# Patient Record
Sex: Female | Born: 1985 | Hispanic: Yes | Marital: Single | Smoking: Never smoker
Health system: Southern US, Community
[De-identification: ages and names within clinical notes are randomized; demographics above are authoritative.]

## PROBLEM LIST (undated history)

## (undated) HISTORY — PX: APPENDECTOMY: SHX54

---

## 2014-07-28 ENCOUNTER — Emergency Department (HOSPITAL_COMMUNITY): Payer: Self-pay

## 2014-07-28 ENCOUNTER — Encounter (HOSPITAL_COMMUNITY): Payer: Self-pay | Admitting: Emergency Medicine

## 2014-07-28 ENCOUNTER — Emergency Department (HOSPITAL_COMMUNITY)
Admission: EM | Admit: 2014-07-28 | Discharge: 2014-07-28 | Disposition: A | Discharge status: Home / Self Care | Payer: Self-pay | Attending: Emergency Medicine | Admitting: Emergency Medicine

## 2014-07-28 DIAGNOSIS — Z3202 Encounter for pregnancy test, result negative: Secondary | ICD-10-CM | POA: Insufficient documentation

## 2014-07-28 DIAGNOSIS — R102 Pelvic and perineal pain: Secondary | ICD-10-CM

## 2014-07-28 DIAGNOSIS — N739 Female pelvic inflammatory disease, unspecified: Secondary | ICD-10-CM | POA: Insufficient documentation

## 2014-07-28 DIAGNOSIS — N938 Other specified abnormal uterine and vaginal bleeding: Secondary | ICD-10-CM | POA: Insufficient documentation

## 2014-07-28 LAB — BASIC METABOLIC PANEL
ANION GAP: 5 (ref 5–15)
BUN: 12 mg/dL (ref 6–23)
CHLORIDE: 103 mmol/L (ref 96–112)
CO2: 28 mmol/L (ref 19–32)
Calcium: 9.4 mg/dL (ref 8.4–10.5)
Creatinine, Ser: 0.62 mg/dL (ref 0.50–1.10)
Glucose, Bld: 93 mg/dL (ref 70–99)
POTASSIUM: 4.3 mmol/L (ref 3.5–5.1)
Sodium: 136 mmol/L (ref 135–145)

## 2014-07-28 LAB — POC URINE PREG, ED: PREG TEST UR: NEGATIVE

## 2014-07-28 LAB — URINALYSIS, ROUTINE W REFLEX MICROSCOPIC
BILIRUBIN URINE: NEGATIVE
Glucose, UA: NEGATIVE mg/dL
Hgb urine dipstick: NEGATIVE
Ketones, ur: NEGATIVE mg/dL
NITRITE: NEGATIVE
PH: 7.5 (ref 5.0–8.0)
Protein, ur: NEGATIVE mg/dL
Specific Gravity, Urine: 1.011 (ref 1.005–1.030)
Urobilinogen, UA: 0.2 mg/dL (ref 0.0–1.0)

## 2014-07-28 LAB — CBC WITH DIFFERENTIAL/PLATELET
BASOS PCT: 1 % (ref 0–1)
Basophils Absolute: 0 10*3/uL (ref 0.0–0.1)
EOS PCT: 5 % (ref 0–5)
Eosinophils Absolute: 0.4 10*3/uL (ref 0.0–0.7)
HEMATOCRIT: 43.8 % (ref 36.0–46.0)
Hemoglobin: 14.6 g/dL (ref 12.0–15.0)
Lymphocytes Relative: 32 % (ref 12–46)
Lymphs Abs: 2.3 10*3/uL (ref 0.7–4.0)
MCH: 28.5 pg (ref 26.0–34.0)
MCHC: 33.3 g/dL (ref 30.0–36.0)
MCV: 85.4 fL (ref 78.0–100.0)
MONO ABS: 0.5 10*3/uL (ref 0.1–1.0)
Monocytes Relative: 7 % (ref 3–12)
NEUTROS ABS: 3.9 10*3/uL (ref 1.7–7.7)
Neutrophils Relative %: 55 % (ref 43–77)
PLATELETS: 337 10*3/uL (ref 150–400)
RBC: 5.13 MIL/uL — ABNORMAL HIGH (ref 3.87–5.11)
RDW: 14.2 % (ref 11.5–15.5)
WBC: 7 10*3/uL (ref 4.0–10.5)

## 2014-07-28 LAB — WET PREP, GENITAL
Trich, Wet Prep: NONE SEEN
Yeast Wet Prep HPF POC: NONE SEEN

## 2014-07-28 LAB — URINE MICROSCOPIC-ADD ON

## 2014-07-28 MED ORDER — LIDOCAINE HCL (PF) 1 % IJ SOLN
INTRAMUSCULAR | Status: AC
Start: 2014-07-28 — End: 2014-07-28
  Administered 2014-07-28: 0.9 mL
  Filled 2014-07-28: qty 5

## 2014-07-28 MED ORDER — DOXYCYCLINE HYCLATE 100 MG PO CAPS: 100.0000 mg | ORAL_CAPSULE | Freq: Two times a day (BID) | ORAL | Status: AC

## 2014-07-28 MED ORDER — METRONIDAZOLE 500 MG PO TABS: 500.0000 mg | ORAL_TABLET | Freq: Two times a day (BID) | ORAL | Status: DC

## 2014-07-28 MED ORDER — DEXTROSE 5 % IV SOLN
1.0000 g | Freq: Once | INTRAVENOUS | Status: DC
  Filled 2014-07-28: qty 10

## 2014-07-28 MED ORDER — CEFTRIAXONE SODIUM 250 MG IJ SOLR
250.0000 mg | Freq: Once | INTRAMUSCULAR | Status: AC
  Administered 2014-07-28: 250 mg via INTRAMUSCULAR
  Filled 2014-07-28: qty 250

## 2014-07-28 MED ORDER — CEFTRIAXONE SODIUM 250 MG IJ SOLR: 250.0000 mg | Freq: Once | INTRAMUSCULAR | Status: DC

## 2014-07-28 NOTE — Discharge Instructions (Signed)
Pelvic Inflammatory Disease °Pelvic inflammatory disease (PID) refers to an infection in some or all of the female organs. The infection can be in the uterus, ovaries, fallopian tubes, or the surrounding tissues in the pelvis. PID can cause abdominal or pelvic pain that comes on suddenly (acute pelvic pain). PID is a serious infection because it can lead to lasting (chronic) pelvic pain or the inability to have children (infertile).  °CAUSES  °The infection is often caused by the normal bacteria found in the vaginal tissues. PID may also be caused by an infection that is spread during sexual contact. PID can also occur following:  °· The birth of a baby.   °· A miscarriage.   °· An abortion.   °· Major pelvic surgery.   °· The use of an intrauterine device (IUD).   °· A sexual assault.   °RISK FACTORS °Certain factors can put a person at higher risk for PID, such as: °· Being younger than 25 years. °· Being sexually active at a young age. °· Using nonbarrier contraception. °· Having multiple sexual partners. °· Having sex with someone who has symptoms of a genital infection. °· Using oral contraception. °Other times, certain behaviors can increase the possibility of getting PID, such as: °· Having sex during your period. °· Using a vaginal douche. °· Having an intrauterine device (IUD) in place. °SYMPTOMS  °· Abdominal or pelvic pain.   °· Fever.   °· Chills.   °· Abnormal vaginal discharge. °· Abnormal uterine bleeding.   °· Unusual pain shortly after finishing your period. °DIAGNOSIS  °Your caregiver will choose some of the following methods to make a diagnosis, such as:  °· Performing a physical exam and history. A pelvic exam typically reveals a very tender uterus and surrounding pelvis.   °· Ordering laboratory tests including a pregnancy test, blood tests, and urine test.  °· Ordering cultures of the vagina and cervix to check for a sexually transmitted infection (STI). °· Performing an ultrasound.    °· Performing a laparoscopic procedure to look inside the pelvis.   °TREATMENT  °· Antibiotic medicines may be prescribed and taken by mouth.   °· Sexual partners may be treated when the infection is caused by a sexually transmitted disease (STD).   °· Hospitalization may be needed to give antibiotics intravenously. °· Surgery may be needed, but this is rare. °It may take weeks until you are completely well. If you are diagnosed with PID, you should also be checked for human immunodeficiency virus (HIV).   °HOME CARE INSTRUCTIONS  °· If given, take your antibiotics as directed. Finish the medicine even if you start to feel better.   °· Only take over-the-counter or prescription medicines for pain, discomfort, or fever as directed by your caregiver.   °· Do not have sexual intercourse until treatment is completed or as directed by your caregiver. If PID is confirmed, your recent sexual partner(s) will need treatment.   °· Keep your follow-up appointments. °SEEK MEDICAL CARE IF:  °· You have increased or abnormal vaginal discharge.   °· You need prescription medicine for your pain.   °· You vomit.   °· You cannot take your medicines.   °· Your partner has an STD.   °SEEK IMMEDIATE MEDICAL CARE IF:  °· You have a fever.   °· You have increased abdominal or pelvic pain.   °· You have chills.   °· You have pain when you urinate.   °· You are not better after 72 hours following treatment.   °MAKE SURE YOU:  °· Understand these instructions. °· Will watch your condition. °· Will get help right away if you are not doing well or get worse. °  Document Released: 03/25/2005 Document Revised: 07/20/2012 Document Reviewed: 03/21/2011 °[employer]® Patient Information ©2015 [employer]. This information is not intended to replace advice given to you by your health care provider. Make sure you discuss any questions you have with your health care provider. ° °

## 2014-07-28 NOTE — ED Notes (Signed)
Pt directed to change into a gown.

## 2014-07-28 NOTE — ED Notes (Signed)
Patient transported to Ultrasound 

## 2014-07-28 NOTE — ED Notes (Signed)
Pt through husband who is interpreting reports abdominal pain x 2 weeks that is worse x 2 days with nausea. Denies vomiting or diarrhea. Denies urinary symptoms. Reports light vaginal bleeding x2 days.

## 2014-07-28 NOTE — ED Notes (Signed)
Pt educated to take all medication to completion.  Verbalized understanding.

## 2014-07-28 NOTE — ED Provider Notes (Signed)
CSN: [id number]     Arrival date & time 07/28/14  4098 History   First MD Initiated Contact with Patient 07/28/14 [phone]     Chief Complaint  Patient presents with  . Abdominal Pain     (Consider location/radiation/quality/duration/timing/severity/associated sxs/prior Treatment) Patient is a 29 y.o. female presenting with abdominal pain. The history is provided by the patient.  Abdominal Pain Associated symptoms: nausea, vaginal bleeding and vomiting   Associated symptoms: no fever, no shortness of breath and no vaginal discharge    patient presents with lower abdominal pain for last 2 weeks. It is dull. States it does radiate up to the epigastric area. No diarrhea or constipation. She has had some nausea and vomiting. States she has had 2 days of slight vaginal bleeding. Denies vaginal discharge. Has had a somewhat decreased appetite. She is 8 months postpartum. States she was found in [place] to have some abnormality in her uterus that could be associated with cancer but states that it was found not to be cancer. The pain is dull and constant. It is worse with certain movements.  History reviewed. No pertinent past medical history. History reviewed. No pertinent past surgical history. No family history on file. History  Substance Use Topics  . Smoking status: Never Smoker   . Smokeless tobacco: Not on file  . Alcohol Use: No   OB History    No data available     Review of Systems  Constitutional: Positive for appetite change. Negative for fever.  Respiratory: Negative for shortness of breath.   Gastrointestinal: Positive for nausea, vomiting and abdominal pain.  Genitourinary: Positive for vaginal bleeding. Negative for vaginal discharge and vaginal pain.      Allergies  Review of patient's allergies indicates no known allergies.  Home Medications   Prior to Admission medications   Medication Sig Start Date End Date Taking? Authorizing Provider  doxycycline (VIBRAMYCIN) 100  MG capsule Take 1 capsule (100 mg total) by mouth 2 (two) times daily. 07/28/14   Benjiman Core, MD  metroNIDAZOLE (FLAGYL) 500 MG tablet Take 1 tablet (500 mg total) by mouth 2 (two) times daily. 07/28/14   Benjiman Core, MD   BP 102/58 mmHg  Pulse 83  Temp(Src) 97.6 F (36.4 C) (Oral)  Resp 16  SpO2 99%  LMP 06/30/2014 Physical Exam  Constitutional: She appears well-developed and well-nourished.  Eyes: Pupils are equal, round, and reactive to light.  Cardiovascular: Normal rate.   Pulmonary/Chest: Effort normal.  Abdominal: There is tenderness.  Lower abdominal tenderness without rebound or guarding. No masses. Mild epigastric tenderness also. Not severe as lower abdomen tenderness.  Musculoskeletal: Normal range of motion.  Neurological: She is alert.  Skin: Skin is warm.  thick white vaginal D/c with some CMT.  ED Course  Procedures (including critical care time) Labs Review Labs Reviewed  WET PREP, GENITAL - Abnormal; Notable for the following:    Clue Cells Wet Prep HPF POC MODERATE (*)    WBC, Wet Prep HPF POC MANY (*)    All other components within normal limits  URINALYSIS, ROUTINE W REFLEX MICROSCOPIC - Abnormal; Notable for the following:    APPearance CLOUDY (*)    Leukocytes, UA SMALL (*)    All other components within normal limits  CBC WITH DIFFERENTIAL/PLATELET - Abnormal; Notable for the following:    RBC 5.13 (*)    All other components within normal limits  URINE MICROSCOPIC-ADD ON - Abnormal; Notable for the following:    Squamous Epithelial /  LPF FEW (*)    Bacteria, UA FEW (*)    All other components within normal limits  BASIC METABOLIC PANEL  RPR  HIV ANTIBODY (ROUTINE TESTING)  POC URINE PREG, ED  GC/CHLAMYDIA PROBE AMP ([place])    Imaging Review No results found.   EKG Interpretation None      MDM   Final diagnoses:  PID (pelvic inflammatory disease)    Patient with abdominal pain. Will treat as PID> will follow with  Gyn.    Benjiman CoreNathan Lucky Alverson, MD 08/01/14 [phone]

## 2014-07-29 LAB — HIV ANTIBODY (ROUTINE TESTING W REFLEX): HIV SCREEN 4TH GENERATION: NONREACTIVE

## 2014-07-29 LAB — RPR: RPR: NONREACTIVE

## 2014-07-29 LAB — GC/CHLAMYDIA PROBE AMP (~~LOC~~) NOT AT ARMC
CHLAMYDIA, DNA PROBE: NEGATIVE
Neisseria Gonorrhea: NEGATIVE

## 2014-12-12 ENCOUNTER — Encounter (HOSPITAL_COMMUNITY): Payer: Self-pay | Admitting: Emergency Medicine

## 2014-12-12 ENCOUNTER — Emergency Department (HOSPITAL_COMMUNITY)
Admission: EM | Admit: 2014-12-12 | Discharge: 2014-12-12 | Disposition: A | Discharge status: Home / Self Care | Payer: Self-pay | Attending: Emergency Medicine | Admitting: Emergency Medicine

## 2014-12-12 DIAGNOSIS — N760 Acute vaginitis: Secondary | ICD-10-CM

## 2014-12-12 DIAGNOSIS — B9689 Other specified bacterial agents as the cause of diseases classified elsewhere: Secondary | ICD-10-CM

## 2014-12-12 DIAGNOSIS — Z3491 Encounter for supervision of normal pregnancy, unspecified, first trimester: Secondary | ICD-10-CM

## 2014-12-12 DIAGNOSIS — N390 Urinary tract infection, site not specified: Secondary | ICD-10-CM

## 2014-12-12 DIAGNOSIS — O23591 Infection of other part of genital tract in pregnancy, first trimester: Secondary | ICD-10-CM | POA: Insufficient documentation

## 2014-12-12 DIAGNOSIS — Z3A00 Weeks of gestation of pregnancy not specified: Secondary | ICD-10-CM | POA: Insufficient documentation

## 2014-12-12 DIAGNOSIS — O2341 Unspecified infection of urinary tract in pregnancy, first trimester: Secondary | ICD-10-CM | POA: Insufficient documentation

## 2014-12-12 LAB — COMPREHENSIVE METABOLIC PANEL
ALK PHOS: 53 U/L (ref 38–126)
ALT: 14 U/L (ref 14–54)
AST: 24 U/L (ref 15–41)
Albumin: 4.1 g/dL (ref 3.5–5.0)
Anion gap: 7 (ref 5–15)
BUN: 8 mg/dL (ref 6–20)
CO2: 25 mmol/L (ref 22–32)
CREATININE: 0.61 mg/dL (ref 0.44–1.00)
Calcium: 9.4 mg/dL (ref 8.9–10.3)
Chloride: 102 mmol/L (ref 101–111)
GFR calc non Af Amer: >60 mL/min (ref >60)
GLUCOSE: 95 mg/dL (ref 65–99)
Potassium: 3.9 mmol/L (ref 3.5–5.1)
SODIUM: 134 mmol/L — AB (ref 135–145)
Total Bilirubin: 0.6 mg/dL (ref 0.3–1.2)
Total Protein: 7.7 g/dL (ref 6.5–8.1)

## 2014-12-12 LAB — CBC
HCT: 39.3 % (ref 36.0–46.0)
Hemoglobin: 13 g/dL (ref 12.0–15.0)
MCH: 28.1 pg (ref 26.0–34.0)
MCHC: 33.1 g/dL (ref 30.0–36.0)
MCV: 85.1 fL (ref 78.0–100.0)
PLATELETS: 322 10*3/uL (ref 150–400)
RBC: 4.62 MIL/uL (ref 3.87–5.11)
RDW: 13.4 % (ref 11.5–15.5)
WBC: 11.2 10*3/uL — ABNORMAL HIGH (ref 4.0–10.5)

## 2014-12-12 LAB — URINALYSIS, ROUTINE W REFLEX MICROSCOPIC
Bilirubin Urine: NEGATIVE
GLUCOSE, UA: NEGATIVE mg/dL
HGB URINE DIPSTICK: NEGATIVE
KETONES UR: NEGATIVE mg/dL
Nitrite: NEGATIVE
PH: 6 (ref 5.0–8.0)
PROTEIN: NEGATIVE mg/dL
Specific Gravity, Urine: 1.031 — ABNORMAL HIGH (ref 1.005–1.030)
Urobilinogen, UA: 1 mg/dL (ref 0.0–1.0)

## 2014-12-12 LAB — URINE MICROSCOPIC-ADD ON

## 2014-12-12 LAB — WET PREP, GENITAL
Trich, Wet Prep: NONE SEEN
YEAST WET PREP: NONE SEEN

## 2014-12-12 LAB — I-STAT BETA HCG BLOOD, ED (MC, WL, AP ONLY): I-stat hCG, quantitative: >2000 m[IU]/mL — ABNORMAL HIGH (ref <5)

## 2014-12-12 LAB — LIPASE, BLOOD: Lipase: 25 U/L (ref 22–51)

## 2014-12-12 MED ORDER — CEPHALEXIN 500 MG PO CAPS: 500.0000 mg | ORAL_CAPSULE | Freq: Three times a day (TID) | ORAL | Status: AC

## 2014-12-12 MED ORDER — METRONIDAZOLE 500 MG PO TABS: 500.0000 mg | ORAL_TABLET | Freq: Two times a day (BID) | ORAL | Status: AC

## 2014-12-12 NOTE — ED Provider Notes (Signed)
CSN: [id number]     Arrival date & time 12/12/14  1620 History   First MD Initiated Contact with Patient 12/12/14 1941     Chief Complaint  Patient presents with  . Abdominal Pain     (Consider location/radiation/quality/duration/timing/severity/associated sxs/prior Treatment) Patient is a 29 y.o. female presenting with abdominal pain. The history is provided by the patient and the spouse.  Abdominal Pain Pain location:  Suprapubic Pain quality: aching   Pain radiates to:  Does not radiate Pain severity:  Mild Onset quality:  Gradual Duration:  2 days Timing:  Constant Progression:  Worsening Chronicity:  New Associated symptoms: no constipation, no cough, no diarrhea, no fever, no shortness of breath, no vaginal bleeding and no vaginal discharge   Associated symptoms comment:  Urinary frequency Risk factors: pregnancy     History reviewed. No pertinent past medical history. No past surgical history on file. No family history on file. Social History  Substance Use Topics  . Smoking status: Never Smoker   . Smokeless tobacco: None  . Alcohol Use: No   OB History    No data available     Review of Systems  Constitutional: Negative for fever.  Respiratory: Negative for cough and shortness of breath.   Gastrointestinal: Positive for abdominal pain. Negative for diarrhea and constipation.  Genitourinary: Negative for vaginal bleeding and vaginal discharge.  All other systems reviewed and are negative.     Allergies  Review of patient's allergies indicates no known allergies.  Home Medications   Prior to Admission medications   Medication Sig Start Date End Date Taking? Authorizing Provider  doxycycline (VIBRAMYCIN) 100 MG capsule Take 1 capsule (100 mg total) by mouth 2 (two) times daily. Patient not taking: Reported on 12/12/2014 07/28/14   Benjiman Core, MD  metroNIDAZOLE (FLAGYL) 500 MG tablet Take 1 tablet (500 mg total) by mouth 2 (two) times daily. Patient  not taking: Reported on 12/12/2014 07/28/14   Benjiman Core, MD   BP 102/64 mmHg  Pulse 92  Temp(Src) 97.4 F (36.3 C) (Oral)  Resp 18  SpO2 100% Physical Exam  Constitutional: She is oriented to person, place, and time. She appears well-developed and well-nourished. No distress.  HENT:  Head: Normocephalic.  Eyes: Conjunctivae are normal.  Neck: Neck supple. No tracheal deviation present.  Cardiovascular: Normal rate and regular rhythm.   Pulmonary/Chest: Effort normal. No respiratory distress.  Abdominal: Soft. She exhibits no distension.  Genitourinary: Uterus normal. Cervix exhibits no motion tenderness. Right adnexum displays no tenderness. Left adnexum displays no tenderness. Vaginal discharge (physiologic) found.  Neurological: She is alert and oriented to person, place, and time.  Skin: Skin is warm and dry.  Psychiatric: She has a normal mood and affect.    ED Course  Procedures (including critical care time)  EMERGENCY DEPARTMENT [place] PREGNANCY "Study: Limited Ultrasound of the Pelvis"  INDICATIONS:Pregnancy(required) and Pelvic pain Multiple views of the uterus and pelvic cavity are obtained with a multi-frequency probe.  APPROACH:Transabdominal   PERFORMED BY: Myself  IMAGES ARCHIVED?: Yes  LIMITATIONS: Body habitus  PREGNANCY FREE FLUID: None  PREGNANCY UTERUS FINDINGS:Uterus normal size and Gestational sac noted ADNEXAL FINDINGS:none  PREGNANCY FINDINGS: Intrauterine gestational sac noted, Fetal pole present and Fetal heart activity seen  INTERPRETATION: Viable intrauterine pregnancy and Pelvic free fluid absent  Labs Review Labs Reviewed  WET PREP, GENITAL - Abnormal; Notable for the following:    Clue Cells Wet Prep HPF POC FEW (*)    WBC, Wet Prep HPF  POC TOO NUMEROUS TO COUNT (*)    All other components within normal limits  COMPREHENSIVE METABOLIC PANEL - Abnormal; Notable for the following:    Sodium 134 (*)    All other components within  normal limits  CBC - Abnormal; Notable for the following:    WBC 11.2 (*)    All other components within normal limits  URINALYSIS, ROUTINE W REFLEX MICROSCOPIC (NOT AT [facility]) - Abnormal; [facility]    APPearance CLOUDY (*)    Specific Gravity, Urine 1.031 (*)    Leukocytes, UA MODERATE (*)    All other components within normal limits  URINE MICROSCOPIC-ADD ON - Abnormal; Notable for the following:    Squamous Epithelial / LPF FEW (*)    Crystals CA OXALATE CRYSTALS (*)    All other components within normal limits  I-STAT BETA HCG BLOOD, ED ([facility], [facility], [facility] ONLY) - Abnormal; Notable for the following:    I-stat hCG, quantitative >2000.0 (*)    All other components within normal limits  LIPASE, BLOOD  RPR  GC/CHLAMYDIA PROBE AMP ([place]) NOT AT [facility]    Imaging Review No results found. I have personally reviewed and evaluated these images and lab results as part of my medical decision-making.   EKG Interpretation None      MDM   Final diagnoses:  UTI (lower urinary tract infection)  BV (bacterial vaginosis)  Normal IUP (intrauterine pregnancy) on prenatal ultrasound, first trimester    29 year old female presents with lower abdominal pain and increased urinary frequency starting yesterday. She is complaining of nausea and headache as well. She is newly pregnant and took a pregnancy test for the first time yesterday. She is nontoxic appearing, afebrile, has stable vital signs during her emergency department course. IUP was identified as above, labs notable for mild leukocytosis and pyuria suggestive of urinary tract infection. Will cover for BV with some discharge, clue cells and symptoms. Recommended prenatal care, vitamins, antibiotics for UTI and follow-up. Plan to follow up with PCP as needed and return precautions discussed for worsening or new concerning symptoms.   Lyndal Pulley, MD 12/13/14 [phone]

## 2014-12-12 NOTE — Discharge Instructions (Signed)
Bacterial Vaginosis Bacterial vaginosis is a vaginal infection that occurs when the normal balance of bacteria in the vagina is disrupted. It results from an overgrowth of certain bacteria. This is the most common vaginal infection in women of childbearing age. Treatment is important to prevent complications, especially in pregnant women, as it can cause a premature delivery. CAUSES  Bacterial vaginosis is caused by an increase in harmful bacteria that are normally present in smaller amounts in the vagina. Several different kinds of bacteria can cause bacterial vaginosis. However, the reason that the condition develops is not fully understood. RISK FACTORS Certain activities or behaviors can put you at an increased risk of developing bacterial vaginosis, including:  Having a new sex partner or multiple sex partners.  Douching.  Using an intrauterine device (IUD) for contraception. Women do not get bacterial vaginosis from toilet seats, bedding, swimming pools, or contact with objects around them. SIGNS AND SYMPTOMS  Some women with bacterial vaginosis have no signs or symptoms. Common symptoms include:  Grey vaginal discharge.  A fishlike odor with discharge, especially after sexual intercourse.  Itching or burning of the vagina and vulva.  Burning or pain with urination. DIAGNOSIS  Your health care provider will take a medical history and examine the vagina for signs of bacterial vaginosis. A sample of vaginal fluid may be taken. Your health care provider will look at this sample under a microscope to check for bacteria and abnormal cells. A vaginal pH test may also be done.  TREATMENT  Bacterial vaginosis may be treated with antibiotic medicines. These may be given in the form of a pill or a vaginal cream. A second round of antibiotics may be prescribed if the condition comes back after treatment.  HOME CARE INSTRUCTIONS   Only take over-the-counter or prescription medicines as  directed by your health care provider.  If antibiotic medicine was prescribed, take it as directed. Make sure you finish it even if you start to feel better.  Do not have sex until treatment is completed.  Tell all sexual partners that you have a vaginal infection. They should see their health care provider and be treated if they have problems, such as a mild rash or itching.  Practice safe sex by using condoms and only having one sex partner. SEEK MEDICAL CARE IF:   Your symptoms are not improving after 3 days of treatment.  You have increased discharge or pain.  You have a fever. MAKE SURE YOU:   Understand these instructions.  Will watch your condition.  Will get help right away if you are not doing well or get worse. FOR MORE INFORMATION  Centers for Disease Control and Prevention, Division of STD Prevention: SolutionApps.co.za American Sexual Health Association (ASHA): www.ashastd.org  Document Released: 03/25/2005 Document Revised: 01/13/2013 Document Reviewed: 11/04/2012 [facility] Patient Information 2015 [place], [place]. This information is not intended to replace advice given to you by your health care provider. Make sure you discuss any questions you have with your health care provider. Primer trimestre de Psychiatrist (First Trimester of Pregnancy) El primer trimestre de Psychiatrist se extiende desde la semana1 hasta el final de la semana12 (mes1 al mes3). Una semana despus de que un espermatozoide fecunda un vulo, este se implantar en la pared uterina. Este embrin comenzar a Camera operator convertirse en un beb. Sus genes y los de su pareja forman el beb. Los genes del varn determinan si ser un nio o una nia. Entre la semana6 y [place], se forman los ojos  y Haynes Hoehn, y los latidos del corazn pueden verse en la ecografa. Al final de las 12semanas, todos los rganos del beb estn formados.  Ahora que est embarazada, querr hacer todo lo que est a su alcance para  tener un beb sano. Dos de las cosas ms importantes son Winferd Humphrey buena atencin prenatal y seguir las indicaciones del mdico. La atencin prenatal incluye toda la asistencia mdica que usted recibe antes del nacimiento del beb. Esta ayudar a prevenir, Engineer, manufacturing y tratar cualquier problema durante el embarazo y [place]. CAMBIOS EN EL ORGANISMO Su organismo atraviesa por muchos cambios durante el [place], y estos varan de Neomia Dear mujer a Educational psychologist.   Al principio, puede aumentar o bajar algunos kilos.  Puede tener Programme researcher, broadcasting/film/video (nuseas) y vomitar. Si no puede controlar los vmitos, llame al mdico.  Puede cansarse con facilidad.  Es posible que tenga dolores de cabeza que pueden aliviarse con los medicamentos que el mdico le permita tomar.  Puede orinar con mayor frecuencia. El dolor al orinar puede significar que usted tiene una infeccin de la vejiga.  Debido al Vanetta Mulders, puede tener acidez estomacal.  Puede estar estreida, ya que ciertas hormonas enlentecen los movimientos de los msculos que [employer] desechos a travs de los intestinos.  Pueden aparecer hemorroides o abultarse e hincharse las venas (venas varicosas).  Las [employer] pueden empezar a Government social research officer y Emergency planning/management officer. Los pezones pueden sobresalir ms, y el tejido que los rodea (areola) tornarse ms oscuro.  Las Veterinary surgeon y estar sensibles al cepillado y al hilo dental.  Pueden aparecer zonas oscuras o manchas (cloasma, mscara del Psychiatrist) en el rostro que probablemente se atenuarn despus del nacimiento del beb.  Los perodos menstruales se interrumpirn.  Tal vez no tenga apetito.  Puede sentir un fuerte deseo de consumir ciertos alimentos.  Puede tener cambios a Theatre manager a da, por ejemplo, por momentos puede estar emocionada por el Psychiatrist y por otros preocuparse porque algo pueda salir mal con el embarazo o el beb.  Tendr sueos ms vvidos y extraos.  Tal vez haya cambios en el  cabello que pueden incluir su engrosamiento, crecimiento rpido y cambios en la textura. A algunas mujeres tambin se les cae el cabello durante o despus del [place], o tienen el cabello seco o fino. Lo ms probable es que el cabello se le normalice despus del nacimiento del beb. QU DEBE ESPERAR EN LAS CONSULTAS PRENATALES Durante una visita prenatal de rutina:  La pesarn para asegurarse de que usted y el beb estn creciendo normalmente.  Le controlarn la presin arterial.  Le medirn el abdomen para controlar el desarrollo del beb.  Se escucharn los latidos cardacos a partir de la semana10 o la12 de embarazo, aproximadamente.  Se analizarn los resultados de los estudios solicitados en visitas anteriores. El mdico puede preguntarle:  Cmo se siente.  Si siente los movimientos del beb.  Si ha tenido sntomas anormales, como prdida de lquido, [place], dolores de cabeza intensos o clicos abdominales.  Si tiene [employer]. Otros estudios que pueden realizarse durante el primer trimestre incluyen lo siguiente:  Anlisis de sangre para determinar el tipo de sangre y Engineer, manufacturing la presencia de infecciones previas. Adems, se los usar para controlar si los niveles de hierro son bajos (anemia) y Chief Strategy Officer los anticuerpos Rh. En una etapa ms avanzada del [place], se harn anlisis de sangre para saber si tiene diabetes, junto con otros estudios si surgen problemas.  Anlisis de [place] para  detectar infecciones, diabetes o protenas en la orina.  Una ecografa para confirmar que el beb crece y se desarrolla correctamente.  Una amniocentesis para diagnosticar posibles problemas genticos.  Estudios del feto para descartar espina bfida y sndrome de [place].  Es posible que necesite otras pruebas adicionales. INSTRUCCIONES PARA EL CUIDADO EN EL HOGAR  Medicamentos:  Siga las indicaciones del mdico en relacin con el uso de medicamentos. Durante el embarazo, hay  medicamentos que pueden tomarse y otros que no.  Tome las vitaminas prenatales como se le indic.  Si est estreida, tome un laxante suave, si el mdico lo [place]. Dieta  Consuma alimentos balanceados. Elija alimentos variados, como carne o protenas de origen vegetal, pescado, leche y productos lcteos descremados, verduras, frutas y panes y Radiation protection practitioner. El mdico la ayudar a Production assistant, radio cantidad de peso que puede [place].  No coma carne cruda ni quesos sin cocinar. Estos elementos contienen bacterias que pueden causar defectos congnitos en el beb.  La ingesta diaria de cuatro o cinco comidas pequeas en lugar de tres comidas abundantes puede ayudar a [employer] nuseas y los vmitos. Si empieza a tener nuseas, comer algunas [address] puede ser de [place]. Beber lquidos [place] comidas en lugar de tomarlos durante las comidas tambin puede ayudar a Optician, dispensing las nuseas y los vmitos.  Si est estreida, consuma alimentos con alto contenido de [place], como verduras y frutas frescas, y Radiation protection practitioner. Beba suficiente lquido para Photographer orina clara o de color amarillo plido. Actividad y Landscape architect ejercicio solamente como se lo haya indicado el mdico. El ejercicio la ayudar a:  Art gallery manager.  Mantenerse en forma.  Estar preparada para el trabajo de parto y [place].  Los dolores, los clicos en la parte baja del abdomen o los calambres en la cintura son un buen indicio de que debe dejar de Corporate treasurer. Consulte al mdico antes de seguir haciendo ejercicios normales.  Intente no estar de pie [employer]Ex. Mueva las piernas con frecuencia si debe estar de pie en un lugar durante mucho tiempo.  Evite levantar pesos [employer].  Use zapatos de tacones bajos y [place].  Puede seguir teniendo [employer], excepto que el mdico le indique lo contrario. Alivio del dolor o las molestias  Use un sostn que  le brinde buen soporte si siente dolor a la palpacin [employer]el.  Dese baos de asiento con agua tibia para Engineer, materials o las molestias causadas por las hemorroides. Use crema antihemorroidal si el mdico se lo permite.  Descanse con las piernas elevadas si tiene calambres o dolor de cintura.  Si tiene venas varicosas en las piernas, use medias de descanso. Eleve los pies durante , 3 o 4veces por da. Limite la cantidad de sal en su dieta. Cuidados prenatales  Programe las visitas prenatales para la semana12 de [place]. Generalmente se programan cada mes al principio y se hacen ms frecuentes en los 2 ltimos meses antes del parto.  Escriba sus preguntas. Llvelas cuando concurra a las visitas prenatales.  Concurra a todas las visitas prenatales como se lo haya indicado el mdico. Seguridad  Colquese el cinturn de seguridad cuando conduzca.  Haga una lista de los nmeros de telfono de Associate Professor, que [employer] nmeros de telfono de familiares, [place], el hospital y los departamentos de polica y bomberos. Consejos generales  [place] al mdico que la derive a clases de educacin prenatal en su localidad. Debe comenzar  a tomar las clases antes de Cytogeneticist en el mes6 de embarazo.  Pida ayuda si tiene necesidades nutricionales o de asesoramiento Academic librarian. El mdico puede aconsejarla o derivarla a especialistas para que la ayuden con diferentes necesidades.  No se d baos de inmersin en agua caliente, baos turcos ni saunas.  No se haga duchas vaginales ni use tampones o toallas higinicas perfumadas.  No mantenga las piernas cruzadas durante [place].  Evite el contacto con las bandejas sanitarias de los gatos y la tierra que estos animales usan. Estos elementos contienen bacterias que pueden causar defectos congnitos al beb y la posible prdida del feto debido a un aborto espontneo o muerte fetal.  No fume, no consuma hierbas ni medicamentos  que no hayan sido recetados por el mdico. Las sustancias qumicas que estos productos contienen afectan la formacin y el desarrollo del beb.  Programe una cita con el dentista. En su casa, lvese los dientes con un cepillo dental blando y psese el hilo dental con suavidad. SOLICITE ATENCIN MDICA SI:   Tiene mareos.  Siente clicos leves, presin en la pelvis o dolor persistente en el abdomen.  Tiene nuseas, vmitos o diarrea persistentes.  Tiene secrecin vaginal con mal olor.  Siente dolor al [employer].  Tiene el rostro, las [place], las piernas o los tobillos ms hinchados. SOLICITE ATENCIN MDICA DE INMEDIATO SI:   Tiene fiebre.  Tiene una prdida de lquido por la vagina.  Tiene sangrado o pequeas prdidas vaginales.  Siente dolor intenso o clicos en el abdomen.  Sube o baja de peso rpidamente.  Vomita sangre de color rojo brillante o material que parezca granos de caf.  Ha estado expuesta a la rubola y no ha sufrido la enfermedad.  Ha estado expuesta a la quinta enfermedad o a la varicela.  Tiene un dolor de cabeza intenso.  Le falta el aire.  Sufre cualquier tipo de traumatismo, por ejemplo, debido a una cada o un accidente automovilstico. Document Released: 01/02/2005 Document Revised: 08/09/2013 [facility] Patient Information 2015 [place], [place]. This information is not intended to replace advice given to you by your health care provider. Make sure you discuss any questions you have with your health care provider. Urinary Tract Infection Urinary tract infections (UTIs) can develop anywhere along your urinary tract. Your urinary tract is your body's drainage system for removing wastes and extra water. Your urinary tract includes two kidneys, two ureters, a bladder, and a urethra. Your kidneys are a pair of bean-shaped organs. Each kidney is about the size of your fist. They are located below your ribs, one on each side of your spine. CAUSES Infections are  caused by microbes, which are microscopic organisms, including fungi, viruses, and bacteria. These organisms are so small that they can only be seen through a microscope. Bacteria are the microbes that most commonly cause UTIs. SYMPTOMS  Symptoms of UTIs may vary by age and gender of the patient and by the location of the infection. Symptoms in young women typically include a frequent and intense urge to urinate and a painful, burning feeling in the bladder or urethra during urination. Older women and men are more likely to be tired, shaky, and weak and have muscle aches and abdominal pain. A fever may mean the infection is in your kidneys. Other symptoms of a kidney infection include pain in your back or sides below the ribs, nausea, and vomiting. DIAGNOSIS To diagnose a UTI, your caregiver will ask you about your symptoms. Your caregiver  also will ask to provide a urine sample. The urine sample will be tested for bacteria and white blood cells. White blood cells are made by your body to help fight infection. TREATMENT  Typically, UTIs can be treated with medication. Because most UTIs are caused by a bacterial infection, they usually can be treated with the use of antibiotics. The choice of antibiotic and length of treatment depend on your symptoms and the type of bacteria causing your infection. HOME CARE INSTRUCTIONS  If you were prescribed antibiotics, take them exactly as your caregiver instructs you. Finish the medication even if you feel better after you have only taken some of the medication.  Drink enough water and fluids to keep your urine clear or pale yellow.  Avoid caffeine, tea, and carbonated beverages. They tend to irritate your bladder.  Empty your bladder often. Avoid holding urine for long periods of time.  Empty your bladder before and after sexual intercourse.  After a bowel movement, women should cleanse from front to back. Use each tissue only once. SEEK MEDICAL CARE IF:     You have back pain.  You develop a fever.  Your symptoms do not begin to resolve within 3 days. SEEK IMMEDIATE MEDICAL CARE IF:   You have severe back pain or lower abdominal pain.  You develop chills.  You have nausea or vomiting.  You have continued burning or discomfort with urination. MAKE SURE YOU:   Understand these instructions.  Will watch your condition.  Will get help right away if you are not doing well or get worse. Document Released: 01/02/2005 Document Revised: 09/24/2011 Document Reviewed: 05/03/2011 [facility] Patient Information 2015 [place], [place]. This information is not intended to replace advice given to you by your health care provider. Make sure you discuss any questions you have with your health care provider.

## 2014-12-12 NOTE — ED Notes (Addendum)
Pt c/o lower abdominal pain, nausea, headache, tiredness, back pain onset 2 weeks ago, worsened today. Pt states last period was July 25, pregnancy test yesterday was positive.

## 2014-12-13 LAB — GC/CHLAMYDIA PROBE AMP (~~LOC~~) NOT AT ARMC
CHLAMYDIA, DNA PROBE: NEGATIVE
Neisseria Gonorrhea: NEGATIVE

## 2014-12-13 LAB — RPR: RPR Ser Ql: NONREACTIVE

## 2015-01-13 ENCOUNTER — Encounter (HOSPITAL_COMMUNITY): Payer: Self-pay | Admitting: Emergency Medicine

## 2015-01-13 ENCOUNTER — Emergency Department (HOSPITAL_COMMUNITY): Payer: Self-pay

## 2015-01-13 ENCOUNTER — Emergency Department (HOSPITAL_COMMUNITY)
Admission: EM | Admit: 2015-01-13 | Discharge: 2015-01-13 | Disposition: A | Discharge status: Home / Self Care | Payer: Self-pay | Attending: Physician Assistant | Admitting: Physician Assistant

## 2015-01-13 DIAGNOSIS — O039 Complete or unspecified spontaneous abortion without complication: Secondary | ICD-10-CM | POA: Insufficient documentation

## 2015-01-13 DIAGNOSIS — Z3A09 9 weeks gestation of pregnancy: Secondary | ICD-10-CM | POA: Insufficient documentation

## 2015-01-13 LAB — I-STAT CHEM 8, ED
BUN: 6 mg/dL (ref 6–20)
CALCIUM ION: 1.22 mmol/L (ref 1.12–1.23)
CHLORIDE: 105 mmol/L (ref 101–111)
Creatinine, Ser: 0.6 mg/dL (ref 0.44–1.00)
GLUCOSE: 96 mg/dL (ref 65–99)
HCT: 41 % (ref 36.0–46.0)
Hemoglobin: 13.9 g/dL (ref 12.0–15.0)
Potassium: 4.5 mmol/L (ref 3.5–5.1)
Sodium: 140 mmol/L (ref 135–145)
TCO2: 25 mmol/L (ref 0–100)

## 2015-01-13 LAB — CBC WITH DIFFERENTIAL/PLATELET
BASOS PCT: 0 %
Basophils Absolute: 0 10*3/uL (ref 0.0–0.1)
EOS ABS: 0.3 10*3/uL (ref 0.0–0.7)
Eosinophils Relative: 3 %
HEMATOCRIT: 38.7 % (ref 36.0–46.0)
Hemoglobin: 13.1 g/dL (ref 12.0–15.0)
Lymphocytes Relative: 22 %
Lymphs Abs: 2.4 10*3/uL (ref 0.7–4.0)
MCH: 28.6 pg (ref 26.0–34.0)
MCHC: 33.9 g/dL (ref 30.0–36.0)
MCV: 84.5 fL (ref 78.0–100.0)
MONO ABS: 0.9 10*3/uL (ref 0.1–1.0)
MONOS PCT: 8 %
Neutro Abs: 7.2 10*3/uL (ref 1.7–7.7)
Neutrophils Relative %: 67 %
Platelets: 309 10*3/uL (ref 150–400)
RBC: 4.58 MIL/uL (ref 3.87–5.11)
RDW: 13.4 % (ref 11.5–15.5)
WBC: 10.9 10*3/uL — ABNORMAL HIGH (ref 4.0–10.5)

## 2015-01-13 LAB — HCG, QUANTITATIVE, PREGNANCY: HCG, BETA CHAIN, QUANT, S: 4697 m[IU]/mL — AB (ref <5)

## 2015-01-13 LAB — WET PREP, GENITAL
Clue Cells Wet Prep HPF POC: NONE SEEN
Trich, Wet Prep: NONE SEEN
WBC, Wet Prep HPF POC: NONE SEEN
YEAST WET PREP: NONE SEEN

## 2015-01-13 LAB — ABO/RH: ABO/RH(D): A POS

## 2015-01-13 MED ORDER — OXYCODONE-ACETAMINOPHEN 5-325 MG PO TABS
1.0000 | ORAL_TABLET | Freq: Once | ORAL | Status: AC
  Administered 2015-01-13: 1 via ORAL
  Filled 2015-01-13: qty 1

## 2015-01-13 NOTE — ED Provider Notes (Signed)
CSN: [id number]     Arrival date & time 01/13/15  1136 History   First MD Initiated Contact with Patient 01/13/15 1232     Chief Complaint  Patient presents with  . Vaginal Bleeding     (Consider location/radiation/quality/duration/timing/severity/associated sxs/prior Treatment) HPI Lauren Howell is a 29 y.o. female G3P1 presents to ED complaining of abdominal pain, vaginal bleeding. Pt states she is [redacted]wks pregnant. Patient states symptoms started yesterday and worsened today. Patient states she saturated 5 pads today. States bleeding is heavier than regular period. Patient states her first pregnancy ended up in miscarriage. Denies any problems with second pregnancy. Patient denies any urinary symptoms. No fever or chills. No back pain.    History reviewed. No pertinent past medical history. Past Surgical History  Procedure Laterality Date  . Appendectomy     No family history on file. Social History  Substance Use Topics  . Smoking status: Never Smoker   . Smokeless tobacco: None  . Alcohol Use: No   OB History    No data available     Review of Systems  Constitutional: Negative for fever and chills.  Respiratory: Negative for cough, chest tightness and shortness of breath.   Cardiovascular: Negative for chest pain, palpitations and leg swelling.  Gastrointestinal: Positive for abdominal pain. Negative for nausea, vomiting and diarrhea.  Genitourinary: Positive for vaginal bleeding and pelvic pain. Negative for dysuria, flank pain, vaginal discharge and vaginal pain.  Musculoskeletal: Negative for myalgias, arthralgias, neck pain and neck stiffness.  Skin: Negative for rash.  Neurological: Negative for dizziness, weakness and headaches.  All other systems reviewed and are negative.     Allergies  Review of patient's allergies indicates no known allergies.  Home Medications   Prior to Admission medications   Medication Sig Start Date End Date Taking? Authorizing  Provider  cephALEXin (KEFLEX) 500 MG capsule Take 1 capsule (500 mg total) by mouth 3 (three) times daily. Patient not taking: Reported on 01/13/2015 12/12/14   Lyndal Pulley, MD  doxycycline (VIBRAMYCIN) 100 MG capsule Take 1 capsule (100 mg total) by mouth 2 (two) times daily. Patient not taking: Reported on 12/12/2014 07/28/14   Benjiman Core, MD  metroNIDAZOLE (FLAGYL) 500 MG tablet Take 1 tablet (500 mg total) by mouth 2 (two) times daily. Patient not taking: Reported on 01/13/2015 12/12/14   Lyndal Pulley, MD   BP 101/67 mmHg  Pulse 81  Temp(Src) 98.4 F (36.9 C) (Oral)  Resp 18  SpO2 100% Physical Exam  Constitutional: She appears well-developed and well-nourished. No distress.  HENT:  Head: Normocephalic.  Eyes: Conjunctivae are normal.  Neck: Neck supple.  Cardiovascular: Normal rate, regular rhythm and normal heart sounds.   Pulmonary/Chest: Effort normal and breath sounds normal. No respiratory distress. She has no wheezes. She has no rales.  Abdominal: Soft. Bowel sounds are normal. She exhibits no distension. There is tenderness. There is no rebound.  Suprapubic tenderness  Genitourinary:  Normal external genitalia. Bright red vaginal blood in the vaginal canal. Cervix closed. No cervical motion tenderness. While uterine tenderness. No adnexal tenderness  Musculoskeletal: She exhibits no edema.  Neurological: She is alert.  Skin: Skin is warm and dry.  Psychiatric: She has a normal mood and affect. Her behavior is normal.  Nursing note and vitals reviewed.   ED Course  Procedures (including critical care time) Labs Review Labs Reviewed  CBC WITH DIFFERENTIAL/PLATELET - Abnormal; Notable for the following:    WBC 10.9 (*)    All other  components within normal limits  HCG, QUANTITATIVE, PREGNANCY - Abnormal; Notable for the following:    hCG, Beta Chain, Quant, S 4697 (*)    All other components within normal limits  WET PREP, GENITAL  I-STAT BETA HCG BLOOD, ED ([facility], [facility],  [facility] ONLY)  I-STAT CHEM 8, ED  ABO/RH  GC/CHLAMYDIA PROBE AMP ([place]) NOT AT [facility]    Imaging Review No results found. I have personally reviewed and evaluated these images and lab results as part of my medical decision-making.   EKG Interpretation None      MDM   Final diagnoses:  None   Patient in emergency department, [redacted] weeks pregnant, having vaginal bleeding and abdominal pain. Vital signs are normal. Patient has had confirmed ultrasound confirming IUP. Will get pelvic exam done, another ultrasound, labs.   4:35 PM Labs unremarkable. Delay in [place], spoke with tech, said she is on the way.  [place] pending. Will sign out to PA west at shift change.   Jaynie Crumble, PA-C 01/13/15 1636  Courteney Randall An, MD 01/16/15 [phone]

## 2015-01-13 NOTE — Discharge Instructions (Signed)
Read the information below.  You may return to the Emergency Department at any time for worsening condition or any new symptoms that concern you.  If you develop high fevers, worsening abdominal pain, uncontrolled vomiting, uncontrolled bleeding, weakness, you pass out, or are unable to tolerate fluids by mouth, return to the ER or go directly to [facility] MAU for a recheck.    Lea la siguiente informacin. Usted puede volver a la sala de urgencias en cualquier momento por cualquier condicin o nuevos sntomas que le preocupan empeoramiento. Si desarrolla fiebre alta, aumento del dolor abdominal, vmitos incontrolados, la hemorragia no controlada, debilidad, un desmayo, o es incapaz de Web designer lquidos por va oral, volver a la sala de emergencias o ir directamente al hospital MAU de la [place]uj[place] de volver a Ecologist.

## 2015-01-13 NOTE — ED Provider Notes (Signed)
4:34 PM Patient signed out to me at change of shift by Jaynie Crumble, PA-C.  Likely threatened abortion.  G3P1 hx 1 spontaneous abortion previously, currently [redacted] weeks gestationm started spotting yesterday.  On exam os is closed.  Pending [place].    6:41 PM US shows no IUP.  Patient and her significant other describe bedside US that previously confirmed IUP.  Dr Garlan Fillers note indicates he did no a bedside US that confirmed IUP.  Given this information, pt has likely had a full spontaneous abortion.  Recommended [facility] follow up.  Discussed results with patient and her significant other (with patient's permission).  D/C home.  Advised [facility] follow up for recheck in the next week.  Results for orders placed or performed during the hospital encounter of 01/13/15  Wet prep, genital  Result Value Ref Range   Yeast Wet Prep HPF POC NONE SEEN NONE SEEN   Trich, Wet Prep NONE SEEN NONE SEEN   Clue Cells Wet Prep HPF POC NONE SEEN NONE SEEN   WBC, Wet Prep HPF POC NONE SEEN NONE SEEN  CBC with Differential  Result Value Ref Range   WBC 10.9 (H) 4.0 - 10.5 K/uL   RBC 4.58 3.87 - 5.11 MIL/uL   Hemoglobin 13.1 12.0 - 15.0 g/dL   HCT 16.1 09.6 - 04.5 %   MCV 84.5 78.0 - 100.0 fL   MCH 28.6 26.0 - 34.0 pg   MCHC 33.9 30.0 - 36.0 g/dL   RDW 40.9 81.1 - 91.4 %   Platelets 309 150 - 400 K/uL   Neutrophils Relative % 67 %   Neutro Abs 7.2 1.7 - 7.7 K/uL   Lymphocytes Relative 22 %   Lymphs Abs 2.4 0.7 - 4.0 K/uL   Monocytes Relative 8 %   Monocytes Absolute 0.9 0.1 - 1.0 K/uL   Eosinophils Relative 3 %   Eosinophils Absolute 0.3 0.0 - 0.7 K/uL   Basophils Relative 0 %   Basophils Absolute 0.0 0.0 - 0.1 K/uL  hCG, quantitative, pregnancy  Result Value Ref Range   hCG, Beta Chain, Quant, S 4697 (H) <5 mIU/mL  I-Stat Chem 8, ED  Result Value Ref Range   Sodium 140 135 - 145 mmol/L   Potassium 4.5 3.5 - 5.1 mmol/L   Chloride 105 101 - 111 mmol/L   BUN 6 6 - 20 mg/dL   Creatinine,  Ser 7.82 0.44 - 1.00 mg/dL   Glucose, Bld 96 65 - 99 mg/dL   Calcium, Ion 9.56 2.13 - 1.23 mmol/L   TCO2 25 0 - 100 mmol/L   Hemoglobin 13.9 12.0 - 15.0 g/dL   HCT 08.6 57.8 - 46.9 %  ABO/Rh  Result Value Ref Range   ABO/RH(D) A POS    US Ob Comp Less 14 Wks  01/13/2015   CLINICAL DATA:  Pelvic pain. Bleeding. Pregnant. Beta HCG is 4,697.  EXAM: OBSTETRIC <14 WK [place] AND TRANSVAGINAL OB US  TECHNIQUE: Both transabdominal and transvaginal ultrasound examinations were performed for complete evaluation of the gestation as well as the maternal uterus, adnexal regions, and pelvic cul-de-sac. Transvaginal technique was performed to assess early pregnancy.  COMPARISON:  None.  FINDINGS: Intrauterine gestational sac: Nonvisualized.  Yolk sac:  Nonvisualized.  Embryo:  Nonvisualized.  Cardiac Activity: Not applicable.  Heart Rate: Not applicable.  Bpm  Maternal uterus/adnexae: No myometrial masses. The endometrial stripe is heterogeneous and measures 11 mm. Ovaries are unremarkable. Trace free fluid. No pelvic mass. No tubal vascular ring.  IMPRESSION: The only finding is a heterogeneous endometrium. This is nonspecific. Differential diagnosis includes spontaneous abortion, early pregnancy, or ectopic pregnancy. Followup ultrasound in 1 week and serial beta HCG levels are recommended.   Electronically Signed   By: Jolaine Click M.D.   On: 01/13/2015 17:42      Trixie Dredge, PA-C 01/13/15 1856  Courteney Randall An, MD 01/16/15 [phone]

## 2015-01-13 NOTE — ED Notes (Signed)
Pt reports she is 8 or [redacted] weeks pregnant. Notice spotting yesterday and today has saturated 5 sanitary napkins. C/o lower abdominal pain. 10/10 pain.

## 2015-01-13 NOTE — Progress Notes (Signed)
CM spoke with pt who confirms uninsured [place] resident with no pcp.  CM discussed and provided written information for uninsured accepting pcps, discussed the importance of pcp vs EDP services for f/u care, www.needymeds.org, www.goodrx.com, discounted pharmacies and other [employer] such as [facility] , [employer], affordable care act, financial assistance, uninsured dental services, [place] med assist, DSS and  health department  Reviewed resources for [employer] uninsured accepting pcps like [facility], family medicine at [facility], [facility], [facility], local urgent care centers, [facility], [facility] family practice, general medical clinics, family services of the [place], [facility] urgent care plus others, medication resources, [facility] out patient [facility]ies and housing Pt voiced understanding and appreciation of resources provided   Provided [facility] contact information Pt unable to understand english well.  Female visitor assisted with translating and reports they have moved from [place], [place] within the last few weeks. Pt is pregnant and needs to apply for [place] medicaid Cm gave pt [place] DSS contact information and encouraged them to start [facility] medicaid application on line - web site given.  Also provided with [facility] women's center contact information also

## 2015-01-13 NOTE — ED Notes (Signed)
Patient transported to Ultrasound 

## 2015-01-16 LAB — GC/CHLAMYDIA PROBE AMP (~~LOC~~) NOT AT ARMC
Chlamydia: NEGATIVE
Neisseria Gonorrhea: NEGATIVE

## 2015-02-03 ENCOUNTER — Emergency Department (HOSPITAL_COMMUNITY): Payer: Self-pay

## 2015-02-03 ENCOUNTER — Emergency Department (HOSPITAL_COMMUNITY)
Admission: EM | Admit: 2015-02-03 | Discharge: 2015-02-03 | Disposition: A | Discharge status: Home / Self Care | Payer: Self-pay | Attending: Emergency Medicine | Admitting: Emergency Medicine

## 2015-02-03 ENCOUNTER — Encounter (HOSPITAL_COMMUNITY): Payer: Self-pay | Admitting: Emergency Medicine

## 2015-02-03 DIAGNOSIS — Y92512 Supermarket, store or market as the place of occurrence of the external cause: Secondary | ICD-10-CM | POA: Insufficient documentation

## 2015-02-03 DIAGNOSIS — M542 Cervicalgia: Secondary | ICD-10-CM

## 2015-02-03 DIAGNOSIS — Y998 Other external cause status: Secondary | ICD-10-CM | POA: Insufficient documentation

## 2015-02-03 DIAGNOSIS — W208XXA Other cause of strike by thrown, projected or falling object, initial encounter: Secondary | ICD-10-CM | POA: Insufficient documentation

## 2015-02-03 DIAGNOSIS — S199XXA Unspecified injury of neck, initial encounter: Secondary | ICD-10-CM | POA: Insufficient documentation

## 2015-02-03 DIAGNOSIS — Y9389 Activity, other specified: Secondary | ICD-10-CM | POA: Insufficient documentation

## 2015-02-03 DIAGNOSIS — S060X0A Concussion without loss of consciousness, initial encounter: Secondary | ICD-10-CM | POA: Insufficient documentation

## 2015-02-03 MED ORDER — IBUPROFEN 200 MG PO TABS
600.0000 mg | ORAL_TABLET | Freq: Once | ORAL | Status: AC
  Administered 2015-02-03: 600 mg via ORAL
  Filled 2015-02-03: qty 3

## 2015-02-03 MED ORDER — CYCLOBENZAPRINE HCL 10 MG PO TABS: 10.0000 mg | ORAL_TABLET | Freq: Three times a day (TID) | ORAL | Status: AC | PRN

## 2015-02-03 MED ORDER — CYCLOBENZAPRINE HCL 10 MG PO TABS
10.0000 mg | ORAL_TABLET | Freq: Once | ORAL | Status: AC
  Administered 2015-02-03: 10 mg via ORAL
  Filled 2015-02-03: qty 1

## 2015-02-03 MED ORDER — IBUPROFEN 800 MG PO TABS: 800.0000 mg | ORAL_TABLET | Freq: Three times a day (TID) | ORAL | Status: AC | PRN

## 2015-02-03 NOTE — Discharge Instructions (Signed)
Concussion, Adult °A concussion is a brain injury. It is caused by: °· A hit to the head. °· A quick and sudden movement (jolt) of the head or neck. °A concussion is usually not life threatening. Even so, it can cause serious problems. If you had a concussion before, you may have concussion-like problems after a hit to your head. °HOME CARE °General Instructions °· Follow your doctor's directions carefully. °· Take medicines only as told by your doctor. °· Only take medicines your doctor says are safe. °· Do not drink alcohol until your doctor says it is okay. Alcohol and some drugs can slow down healing. They can also put you at risk for further injury. °· If you are having trouble remembering things, write them down. °· Try to do one thing at a time if you get distracted easily. For example, do not watch TV while making dinner. °· Talk to your family members or close friends when making important decisions. °· Follow up with your doctor as told. °· Watch your symptoms. Tell others to do the same. Serious problems can sometimes happen after a concussion. Older adults are more likely to have these problems. °· Tell your teachers, school nurse, school counselor, coach, athletic trainer, or work manager about your concussion. Tell them about what you can or cannot do. They should watch to see if: °¨ It gets even harder for you to pay attention or concentrate. °¨ It gets even harder for you to remember things or learn new things. °¨ You need more time than normal to finish things. °¨ You become annoyed (irritable) more than before. °¨ You are not able to deal with stress as well. °¨ You have more problems than before. °· Rest. Make sure you: °¨ Get plenty of sleep at night. °¨ Go to sleep early. °¨ Go to bed at the same time every day. Try to wake up at the same time. °¨ Rest during the day. °¨ Take naps when you feel tired. °· Limit activities where you have to think a lot or concentrate. These include: °¨ Doing  homework. °¨ Doing work related to a job. °¨ Watching TV. °¨ Using the computer. °Returning To Your Regular Activities °Return to your normal activities slowly, not all at once. You must give your body and brain enough time to heal.  °· Do not play sports or do other athletic activities until your doctor says it is okay. °· Ask your doctor when you can drive, ride a bicycle, or work other vehicles or machines. Never do these things if you feel dizzy. °· Ask your doctor about when you can return to work or school. °Preventing Another Concussion °It is very important to avoid another brain injury, especially before you have healed. In rare cases, another injury can lead to permanent brain damage, brain swelling, or death. The risk of this is greatest during the first 7-10 days after your injury. Avoid injuries by:  °· Wearing a seat belt when riding in a car. °· Not drinking too much alcohol. °· Avoiding activities that could lead to a second concussion (such as contact sports). °· Wearing a helmet when doing activities like: °¨ Biking. °¨ Skiing. °¨ Skateboarding. °¨ Skating. °· Making your home safer by: °¨ Removing things from the floor or stairways that could make you trip. °¨ Using grab bars in bathrooms and handrails by stairs. °¨ Placing non-slip mats on floors and in bathtubs. °¨ Improve lighting in dark areas. °GET HELP IF: °·   It gets even harder for you to pay attention or concentrate. °· It gets even harder for you to remember things or learn new things. °· You need more time than normal to finish things. °· You become annoyed (irritable) more than before. °· You are not able to deal with stress as well. °· You have more problems than before. °· You have problems keeping your balance. °· You are not able to react quickly when you should. °Get help if you have any of these problems for more than 2 weeks:  °· Lasting (chronic) headaches. °· Dizziness or trouble balancing. °· Feeling sick to your stomach  (nausea). °· Seeing (vision) problems. °· Being affected by noises or light more than normal. °· Feeling sad, low, down in the dumps, blue, gloomy, or empty (depressed). °· Mood changes (mood swings). °· Feeling of fear or nervousness about what may happen (anxiety). °· Feeling annoyed. °· Memory problems. °· Problems concentrating or paying attention. °· Sleep problems. °· Feeling tired all the time. °GET HELP RIGHT AWAY IF:  °· You have bad headaches or your headaches get worse. °· You have weakness (even if it is in one hand, leg, or part of the face). °· You have loss of feeling (numbness). °· You feel off balance. °· You keep throwing up (vomiting). °· You feel tired. °· One black center of your eye (pupil) is larger than the other. °· You twitch or shake violently (convulse). °· Your speech is not clear (slurred). °· You are more confused, easily angered (agitated), or annoyed than before. °· You have more trouble resting than before. °· You are unable to recognize people or places. °· You have neck pain. °· It is difficult to wake you up. °· You have unusual behavior changes. °· You pass out (lose consciousness). °MAKE SURE YOU:  °· Understand these instructions. °· Will watch your condition. °· Will get help right away if you are not doing well or get worse. °  °This information is not intended to replace advice given to you by your health care provider. Make sure you discuss any questions you have with your health care provider. °  °Document Released: 03/13/2009 Document Revised: 04/15/2014 Document Reviewed: 10/15/2012 °Elsevier Interactive Patient Education ©2016 Elsevier Inc. ° °

## 2015-02-03 NOTE — Progress Notes (Signed)
CM spoke with pt who confirms uninsured [place] resident with no pcp.  CM discussed and provided written information for uninsured accepting pcps, discussed the importance of pcp vs EDP services for f/u care, www.needymeds.org, www.goodrx.com, discounted pharmacies and other [place] resources such as [facility] , [facility], affordable care act, financial assistance, uninsured dental services, [place] med assist, DSS and  health department  Reviewed resources for [place] uninsured accepting pcps like [facility], family medicine at [address], [facility], [facility], local urgent care centers, [facility], [facility], general medical clinics, family services of the [place], [facility] plus others, medication resources, [facility] out [facility]ies and housing Pt voiced understanding and appreciation of resources provided   Provided [facility] contact information Pt agreed to a referral Cm completed referral Pt to be contact by [facility] clinical liason  

## 2015-02-03 NOTE — ED Provider Notes (Signed)
CSN: [id number]     Arrival date & time 02/03/15  1126 History   First MD Initiated Contact with Patient 02/03/15 1138     Chief Complaint  Patient presents with  . Neck Pain  . Dizziness     (Consider location/radiation/quality/duration/timing/severity/associated sxs/prior Treatment) Patient is a 29 y.o. female presenting with neck pain and dizziness. The history is provided by the patient. The history is limited by a language barrier.  Neck Pain Pain location:  Generalized neck Pain radiates to:  Does not radiate Pain severity:  Moderate Onset quality:  Gradual Timing:  Constant Progression:  Worsening Chronicity:  New Context: recent injury   Relieved by:  None tried Ineffective treatments:  None tried Associated symptoms: headaches   Associated symptoms: no bladder incontinence, no bowel incontinence, no chest pain and no fever   Dizziness Quality:  Lightheadedness Severity:  Mild Onset quality:  Gradual Timing:  Intermittent Progression:  Worsening Chronicity:  New Associated symptoms: headaches   Associated symptoms: no chest pain, no diarrhea, no nausea, no shortness of breath and no vomiting     Lauren Howell is a 29 yo that is presenting today with neck pain after a box fell on her neck at [facility]. This occurred yesterday and the pain has gotten progressively worse since the accident. She is trying to get a box down from an upper shelf and fell on the back of her neck. They placed ice on it at [facility] and she will home. The said if she had worsening of her symptoms and to be seen in the emergency department. She is now having pain paraspinally in her cervical spine. She has having pain with any lateral rotation of her head, flexion, or extension. She denies having any prior injuries to her and her neck. Reports that she started feeling lightheadedness since the accident. Worse when she is standing. She is not taking any medication on a regular basis. She did not lose any  consciousness when the accident occurred. She denies any chest pain, shortness of breath, nausea, vomiting, abdominal pain, dysuria, constipation, or diarrhea.  History reviewed. No pertinent past medical history. Past Surgical History  Procedure Laterality Date  . Appendectomy     History reviewed. No pertinent family history. Social History  Substance Use Topics  . Smoking status: Never Smoker   . Smokeless tobacco: None  . Alcohol Use: No   OB History    No data available     Review of Systems  Constitutional: Negative for fever and chills.  Respiratory: Negative for cough and shortness of breath.   Cardiovascular: Negative for chest pain.  Gastrointestinal: Negative for nausea, vomiting, abdominal pain, diarrhea, constipation and bowel incontinence.  Genitourinary: Negative for bladder incontinence and dysuria.  Musculoskeletal: Positive for neck pain. Negative for back pain.  Skin: Negative for wound.  Neurological: Positive for dizziness and headaches.  Psychiatric/Behavioral: Negative for behavioral problems.      Allergies  Review of patient's allergies indicates no known allergies.  Home Medications   Prior to Admission medications   Medication Sig Start Date End Date Taking? Authorizing Provider  cephALEXin (KEFLEX) 500 MG capsule Take 1 capsule (500 mg total) by mouth 3 (three) times daily. Patient not taking: Reported on 01/13/2015 12/12/14   Lyndal Pulley, MD  cyclobenzaprine (FLEXERIL) 10 MG tablet Take 1 tablet (10 mg total) by mouth 3 (three) times daily as needed for muscle spasms. 02/03/15   Myra Rude, MD  doxycycline (VIBRAMYCIN) 100 MG capsule Take  1 capsule (100 mg total) by mouth 2 (two) times daily. Patient not taking: Reported on 12/12/2014 07/28/14   Benjiman Core, MD  ibuprofen (ADVIL,MOTRIN) 800 MG tablet Take 1 tablet (800 mg total) by mouth every 8 (eight) hours as needed. 02/03/15   Myra Rude, MD  metroNIDAZOLE (FLAGYL) 500 MG  tablet Take 1 tablet (500 mg total) by mouth 2 (two) times daily. Patient not taking: Reported on 01/13/2015 12/12/14   Lyndal Pulley, MD   BP 102/73 mmHg  Pulse 68  Temp(Src) 98.3 F (36.8 C) (Oral)  Resp 16  Ht 5' (1.524 m)  Wt 170 lb (77.111 kg)  BMI 33.20 kg/m2  SpO2 100%  LMP  Physical Exam  Constitutional: She is oriented to person, place, and time. She appears well-developed and well-nourished.  HENT:  Head: Normocephalic and atraumatic.  Eyes: Conjunctivae and EOM are normal. Pupils are equal, round, and reactive to light.  Neck: Neck supple.  Limited range of motion with lateral rotation, extension, and flexion. Tenderness to palpation of the paraspinal muscles. No obvious deformity Negative Spurling's test  Cardiovascular: Normal rate, regular rhythm, normal heart sounds and intact distal pulses.   No murmur heard. Pulmonary/Chest: Effort normal and breath sounds normal. No respiratory distress. She has no wheezes.  Abdominal: Soft. Bowel sounds are normal. She exhibits no distension. There is no tenderness.  Musculoskeletal: She exhibits no edema.  5 out of 5 strength in upper and lower extremities bilaterally, Normal grip strength Normal plantar and dorsiflexion Normal range of motion of shoulders bilaterally  Neurological: She is alert and oriented to person, place, and time. No cranial nerve deficit.  Skin: Skin is warm. No rash noted.  Psychiatric: She has a normal mood and affect.    ED Course  Procedures (including critical care time) Labs Review Labs Reviewed - No data to display  Imaging Review Dg Cervical Spine Complete  02/03/2015  CLINICAL DATA:  Neck pain, dizziness EXAM: CERVICAL SPINE - COMPLETE 4+ VIEW COMPARISON:  None. FINDINGS: There is no evidence of cervical spine fracture or prevertebral soft tissue swelling. Alignment is normal. No other significant bone abnormalities are identified. IMPRESSION: Negative cervical spine radiographs.  Electronically Signed   By: Elige Ko   On: 02/03/2015 13:36   Ct Head Wo Contrast  02/03/2015  CLINICAL DATA:  29 year old female with acute dizziness, headache and posterior cervical spine pain following object falling on head yesterday. Initial encounter. EXAM: CT HEAD WITHOUT CONTRAST CT CERVICAL SPINE WITHOUT CONTRAST TECHNIQUE: Multidetector CT imaging of the head and cervical spine was performed following the standard protocol without intravenous contrast. Multiplanar CT image reconstructions of the cervical spine were also generated. COMPARISON:  None. FINDINGS: CT HEAD FINDINGS No intracranial abnormalities are identified, including mass lesion or mass effect, hydrocephalus, extra-axial fluid collection, midline shift, hemorrhage, or acute infarction. The visualized bony calvarium is unremarkable. CT CERVICAL SPINE FINDINGS Straightening of the normal cervical lordosis is noted. There is no evidence of acute fracture, subluxation or prevertebral soft tissue swelling. The disc spaces are maintained. No focal bony lesions are present. Bony structure in the expected location of the right C2 spinous process is remote and may represent congenital abnormality or remote injury. The soft tissue structures and lung apices are unremarkable. IMPRESSION: Unremarkable noncontrast head CT. No static evidence of acute injury to the cervical spine. Electronically Signed   By: Harmon Pier M.D.   On: 02/03/2015 15:24   Ct Cervical Spine Wo Contrast  02/03/2015  CLINICAL  DATA:  29 year old female with acute dizziness, headache and posterior cervical spine pain following object falling on head yesterday. Initial encounter. EXAM: CT HEAD WITHOUT CONTRAST CT CERVICAL SPINE WITHOUT CONTRAST TECHNIQUE: Multidetector CT imaging of the head and cervical spine was performed following the standard protocol without intravenous contrast. Multiplanar CT image reconstructions of the cervical spine were also generated.  COMPARISON:  None. FINDINGS: CT HEAD FINDINGS No intracranial abnormalities are identified, including mass lesion or mass effect, hydrocephalus, extra-axial fluid collection, midline shift, hemorrhage, or acute infarction. The visualized bony calvarium is unremarkable. CT CERVICAL SPINE FINDINGS Straightening of the normal cervical lordosis is noted. There is no evidence of acute fracture, subluxation or prevertebral soft tissue swelling. The disc spaces are maintained. No focal bony lesions are present. Bony structure in the expected location of the right C2 spinous process is remote and may represent congenital abnormality or remote injury. The soft tissue structures and lung apices are unremarkable. IMPRESSION: Unremarkable noncontrast head CT. No static evidence of acute injury to the cervical spine. Electronically Signed   By: Harmon PierJeffrey  Hu M.D.   On: 02/03/2015 15:24   I have personally reviewed and evaluated these images and lab results as part of my medical decision-making.   EKG Interpretation None       Medications  cyclobenzaprine (FLEXERIL) tablet 10 mg (10 mg Oral Given 02/03/15 1250)  ibuprofen (ADVIL,MOTRIN) tablet 600 mg (600 mg Oral Given 02/03/15 1432)    MDM   Final diagnoses:  Neck pain  Concussion, without loss of consciousness, initial encounter   Lauren Howell is a 29 yo that is presenting [facility] Imaging is negative. Orthostatics are negative. She had improvement of her symptoms with flexeril.  Dizziness can be related to probably concussion. Given ibuprofen and flexeril upon discharge. Stable for discharge and given return precautions.   Myra RudeJeremy E Schmitz, MD PGY-3, [facility] 02/03/2015, 3:50 PM     Myra RudeJeremy E Schmitz, MD 02/03/15 1550  Nelva Nayobert Beaton, MD 02/10/15 2212

## 2015-02-03 NOTE — ED Notes (Signed)
Patient transported to X-ray 

## 2015-02-03 NOTE — ED Notes (Signed)
Patient was pulling object off top shelf yesterday when it fell and hit her in the head. She denies loss of consciousness but is having continued dizziness, pain in her upper neck, and pain in her right upper posterior head. Pain 9/10. She is spanish speaking, accompanied by mother and child.

## 2015-02-03 NOTE — ED Notes (Addendum)
Pt began to have posterior neck pain after getting a box down from a high shelf yesterday. Did not hit head. Pt then adds she has been having dizziness since yesterday. Denies CP or SOB

## 2016-05-10 IMAGING — US US TRANSVAGINAL NON-OB
1 series · 13 of 25 positions shown · non-contrast
Comparison: None

CLINICAL DATA: Lower pelvic pain 2 weeks which is worse over the
past 2 days with nausea. Previous appendectomy. Negative pregnancy
test. Gave birth 8 months ago.

EXAM:
TRANSABDOMINAL AND TRANSVAGINAL ULTRASOUND OF PELVIS
TECHNIQUE: Both transabdominal and transvaginal ultrasound examinations of the
pelvis were performed. Transabdominal technique was performed for
global imaging of the pelvis including uterus, ovaries, adnexal
regions, and pelvic cul-de-sac. It was necessary to proceed with
endovaginal exam following the transabdominal exam to visualize the
endometrium and ovaries.

[Series 1: us transvaginal non-ob · 0.17mm/px · 13 of 81 slices shown]
[im 1/81]
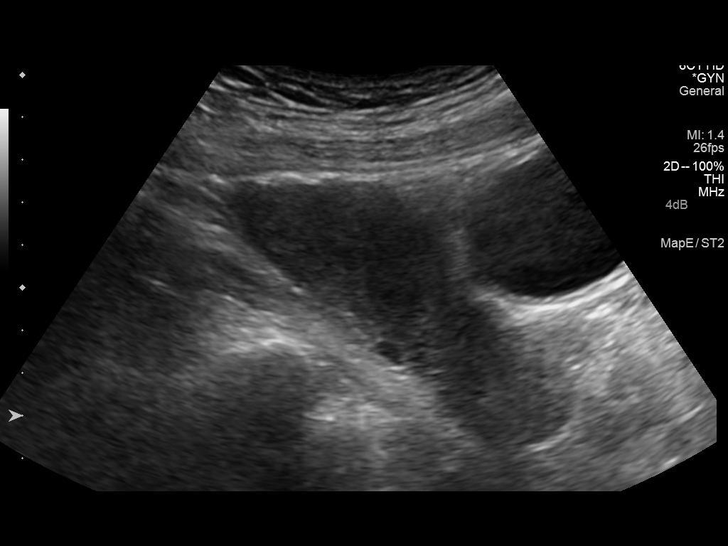
[im 7/81]
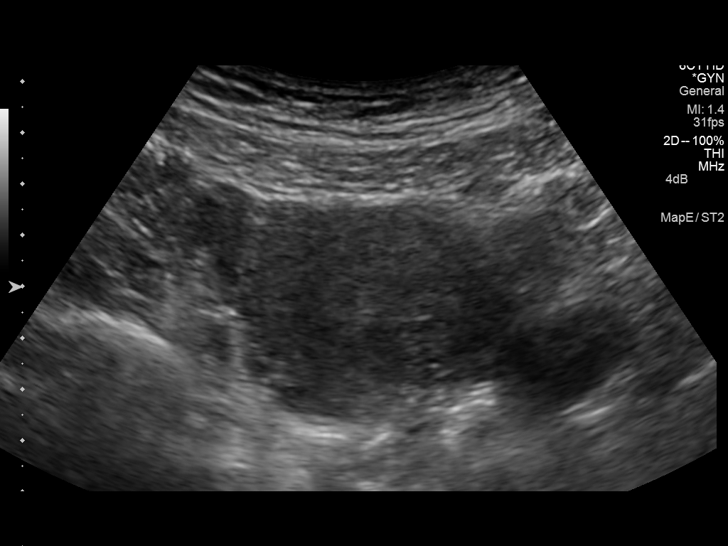
[im 14/81]
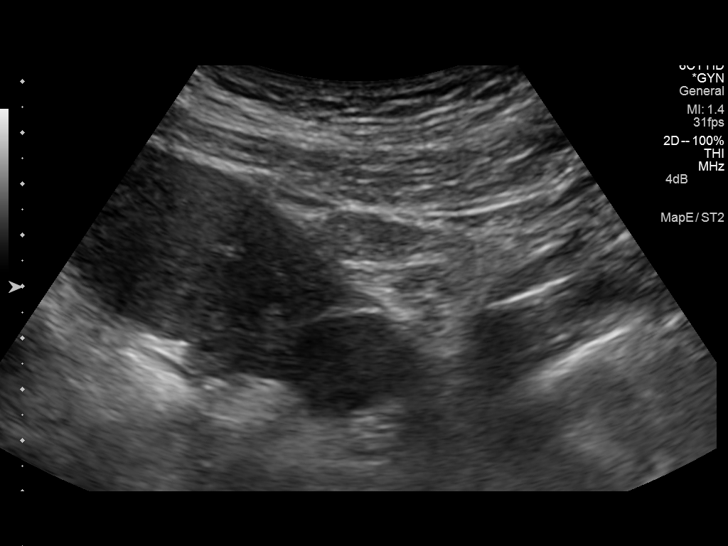
[im 21/81]
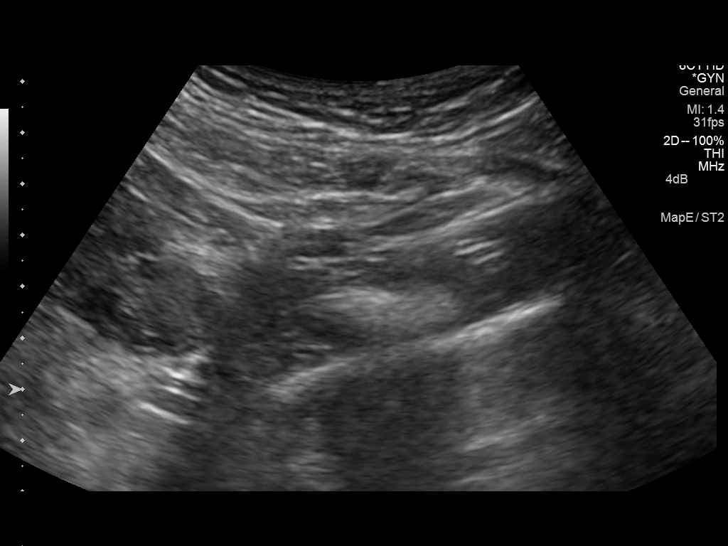
[im 27/81]
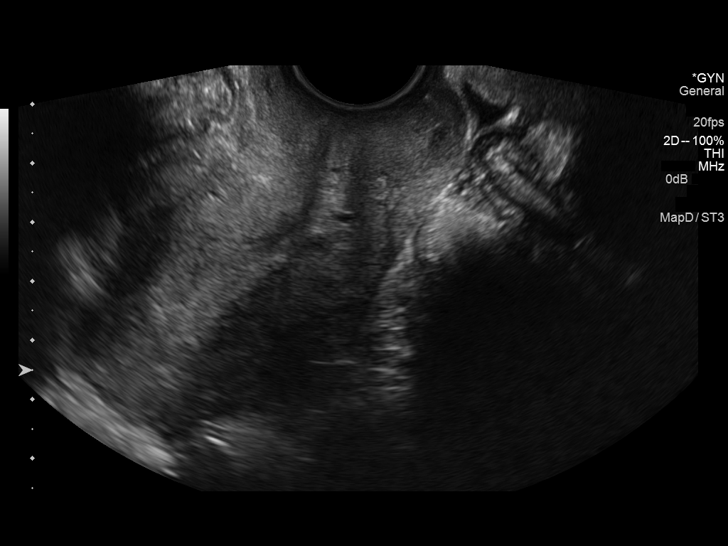
[im 34/81]
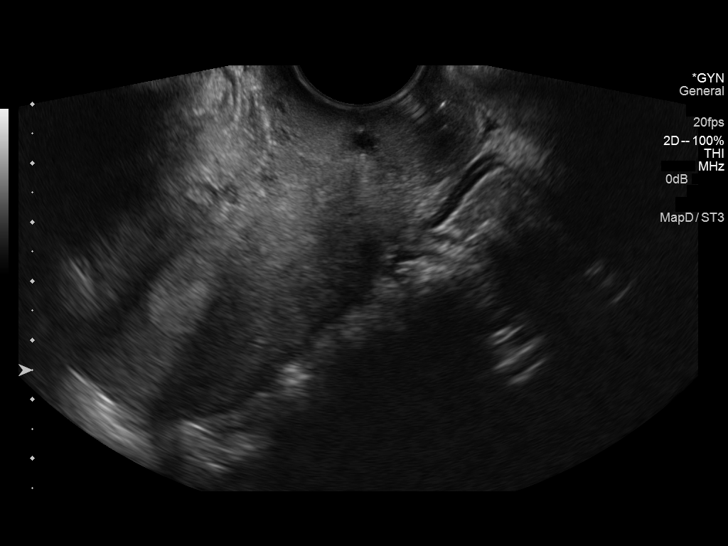
[im 41/81]
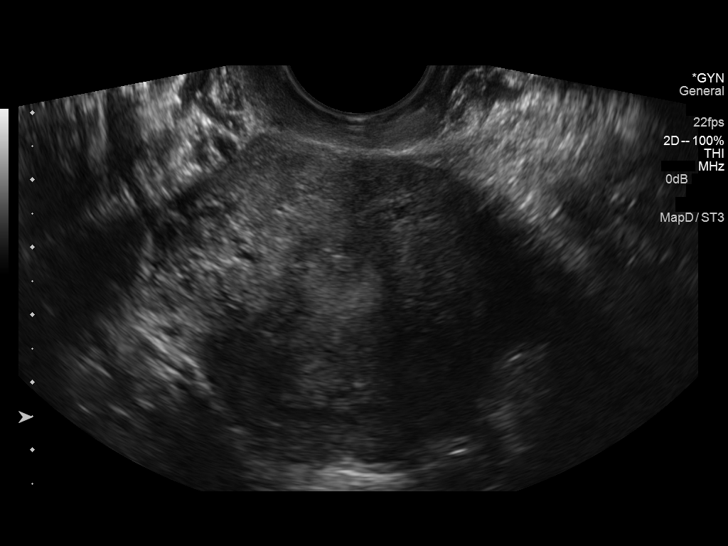
[im 47/81]
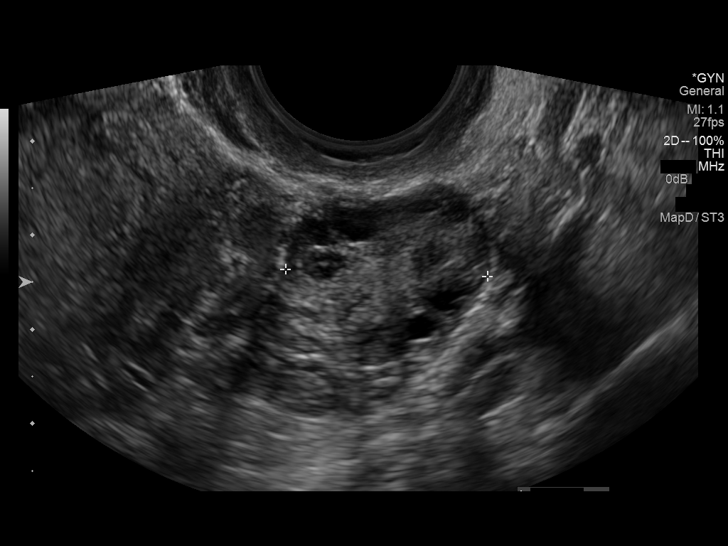
[im 54/81]
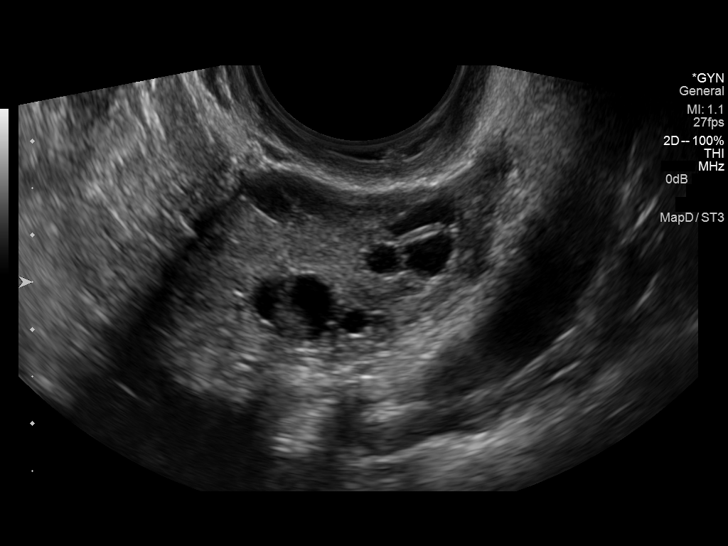
[im 61/81]
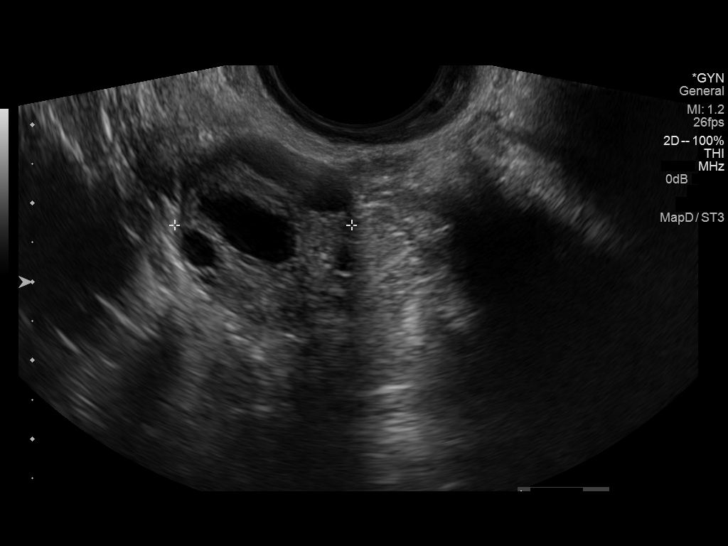
[im 67/81]
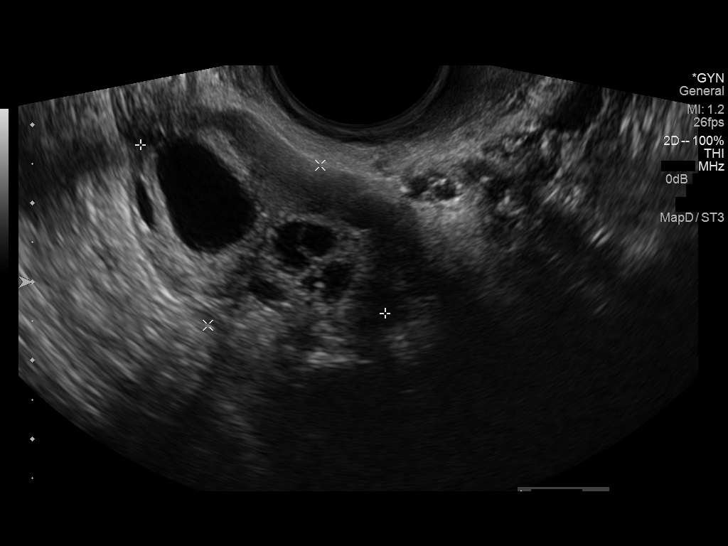
[im 74/81]
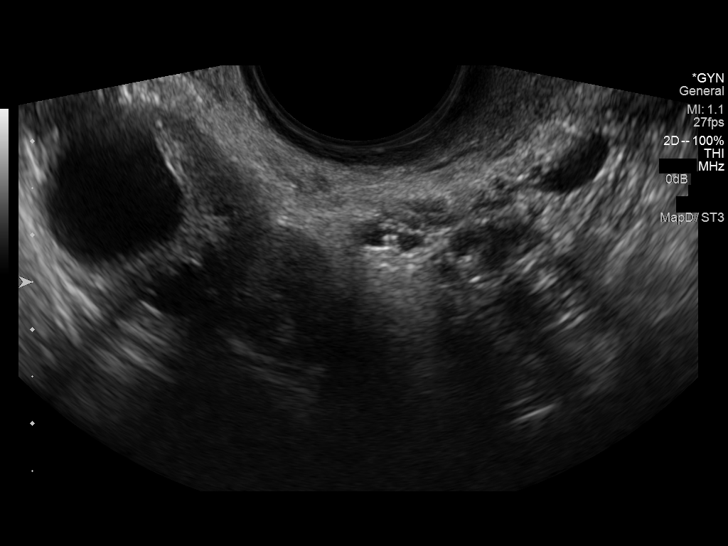
[im 81/81]
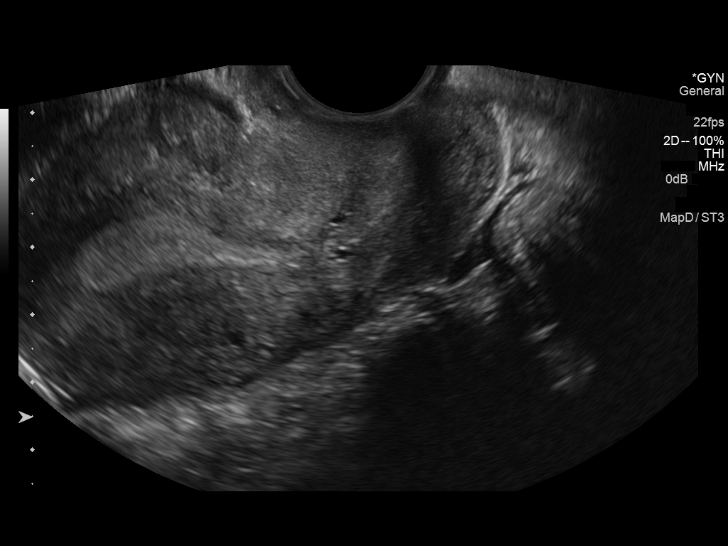

[13 of 25 positions shown; findings below may reference images not displayed]

FINDINGS: Uterus

Measurements: 3.9 x 5.4 x 9.0 cm. No fibroids or other mass
visualized. Nabothian cyst of the cervix.

Endometrium

Thickness: 13.4 mm.  No focal abnormality visualized.

Right ovary

Measurements: 2.5 x 2.3 x 3.8 cm. Normal appearance/no adnexal mass.
1.700 dominant follicle. Normal color flow.

Left ovary

Measurements: 2.2 x 2.1 x 3.2 cm. Normal appearance/no adnexal mass.
Normal color flow.

Other findings

Minimal free pelvic fluid in the cul-de-sac.
IMPRESSION: Minimal free fluid in the cul-de-sac likely physiologic, otherwise
normal pelvic ultrasound.

## 2016-11-16 IMAGING — CT CT CERVICAL SPINE W/O CM
4 of 6 series · 14 of 33 positions shown, 16 images · non-contrast
Comparison: None.

CLINICAL DATA: 29-year-old female with acute dizziness, headache
and posterior cervical spine pain following object falling on head
yesterday. Initial encounter.

EXAM:
CT HEAD WITHOUT CONTRAST
CT CERVICAL SPINE WITHOUT CONTRAST
TECHNIQUE: Multidetector CT imaging of the head and cervical spine was
performed following the standard protocol without intravenous
contrast. Multiplanar CT image reconstructions of the cervical spine
were also generated.

[Series 4: c-spine st · axial · 0.31mm/px · z∈[-350,-262]mm · 3 of 90 slices shown]
[im 23/90  bone]
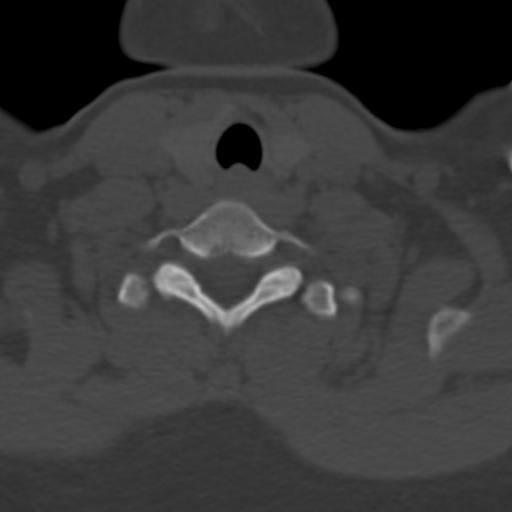
[im 45/90  bone]
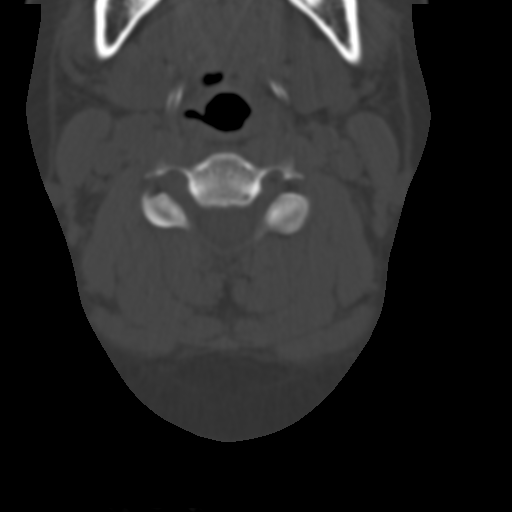
[im 67/90  bone]
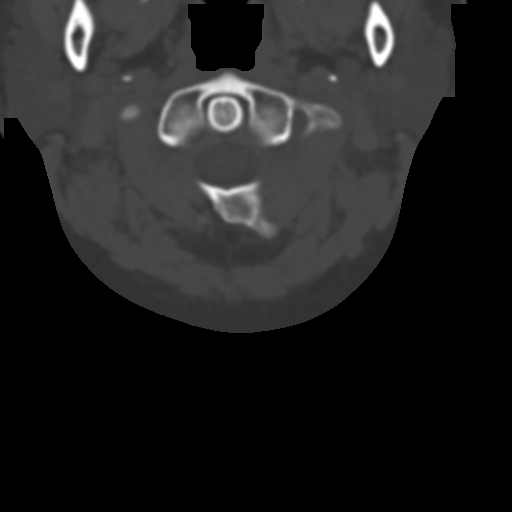

[Series 7: axial recon · axial · 0.23mm/px · z∈[-378,-281]mm · 3 of 104 slices shown, 4 images]
[im 26/104  soft-tissue]
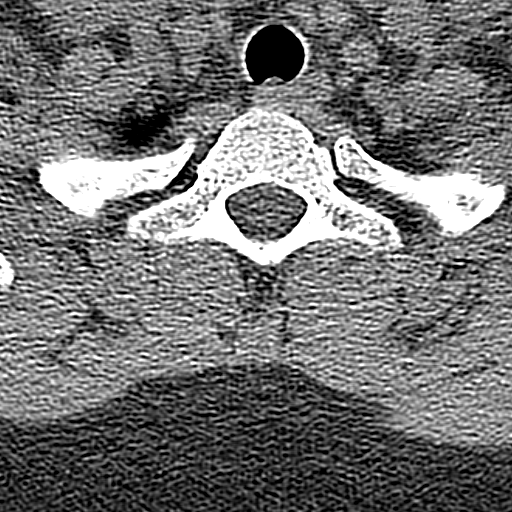
[im 26/104  bone]
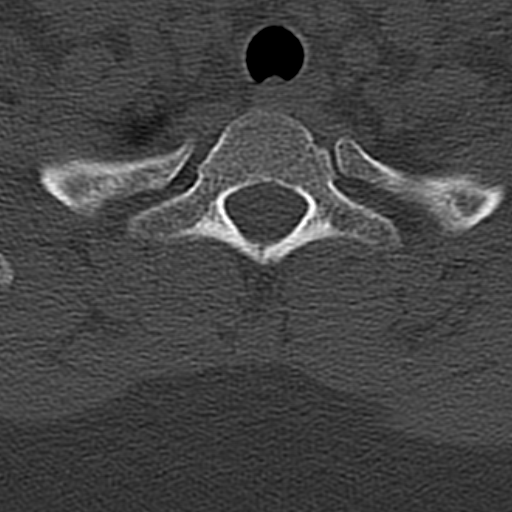
[im 52/104  bone]
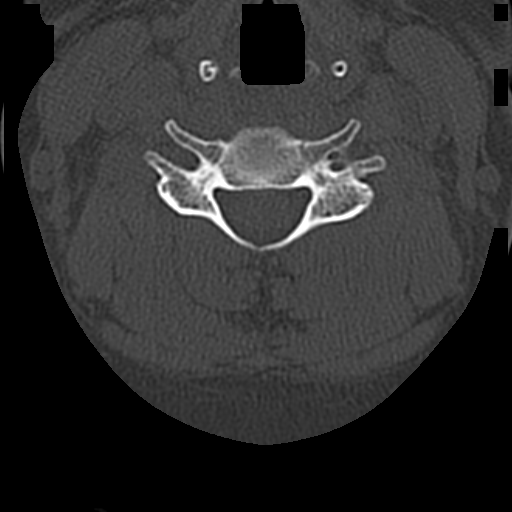
[im 78/104  bone]
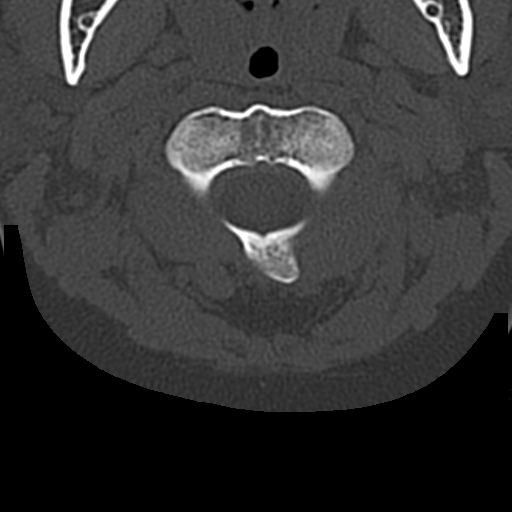

[Series 8: coronal · coronal · 0.26mm/px · 3 of 42 slices shown]
[im 9/42  bone]
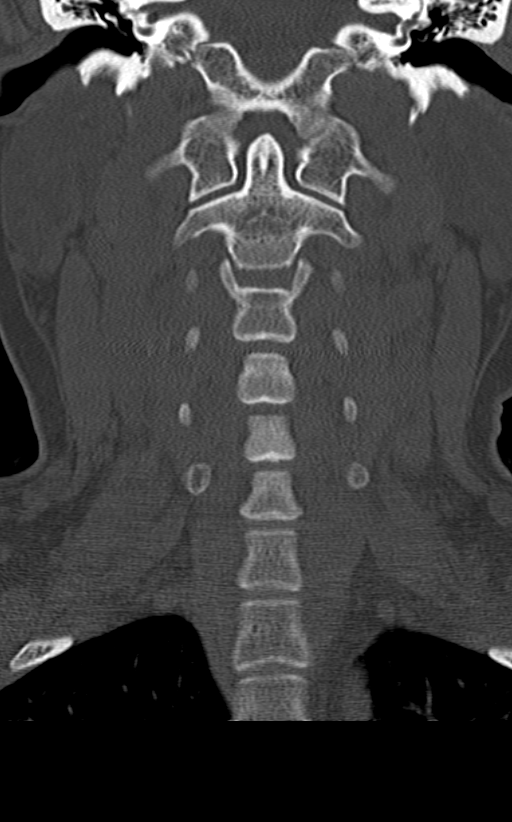
[im 17/42  bone]
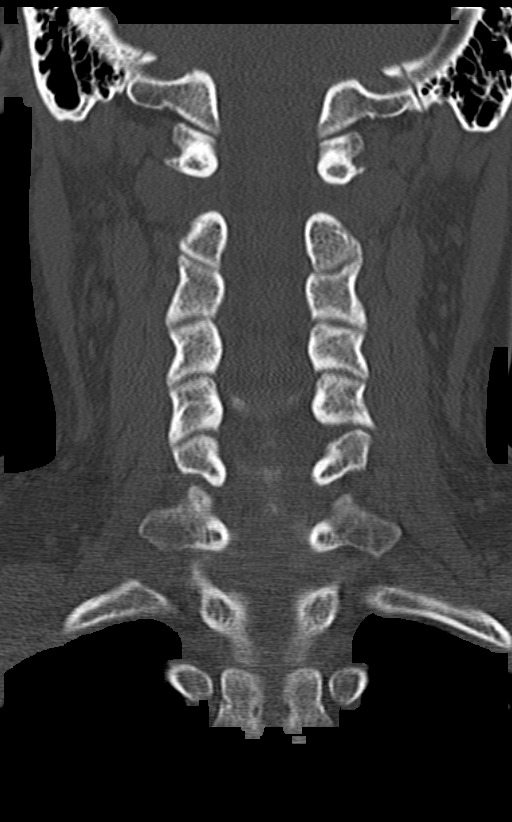
[im 25/42  bone]
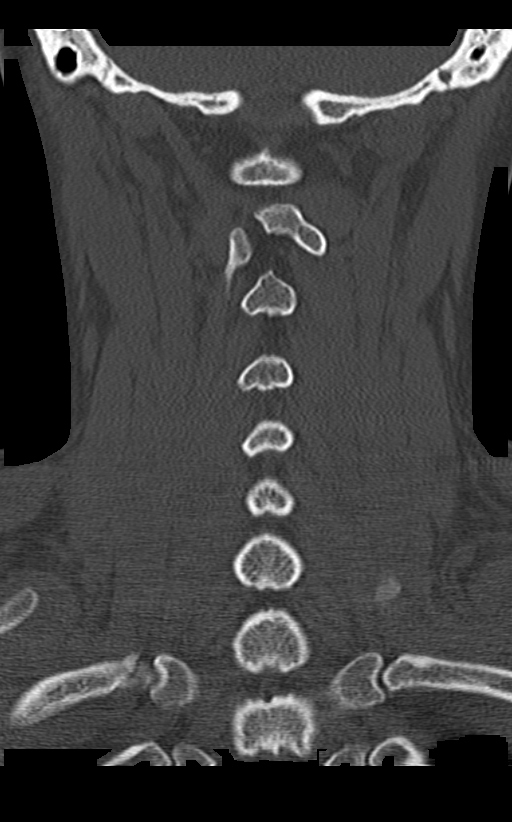

[Series 9: sagittal · sagittal · 0.30mm/px · 5 of 51 slices shown, 6 images]
[im 17/51  bone]
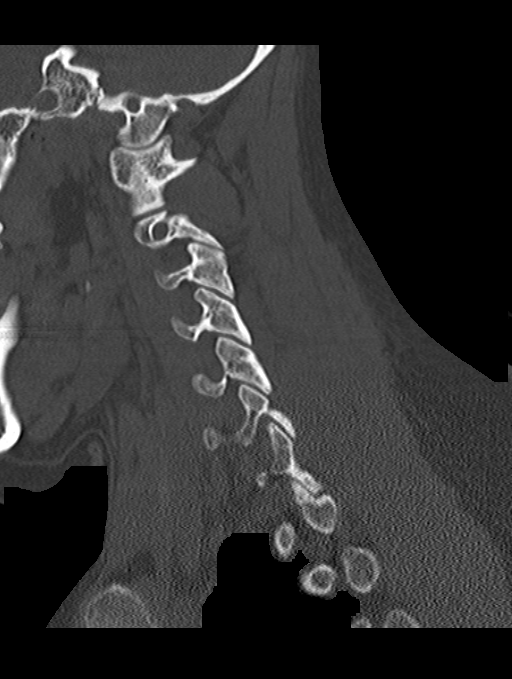
[im 21/51  bone]
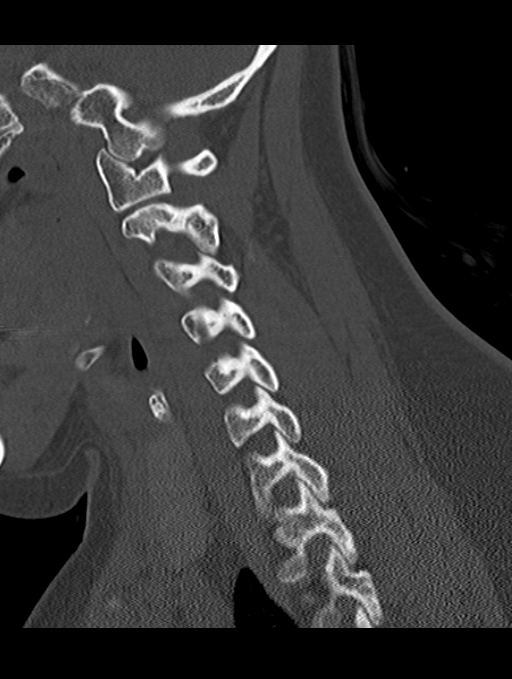
[im 26/51  soft-tissue]
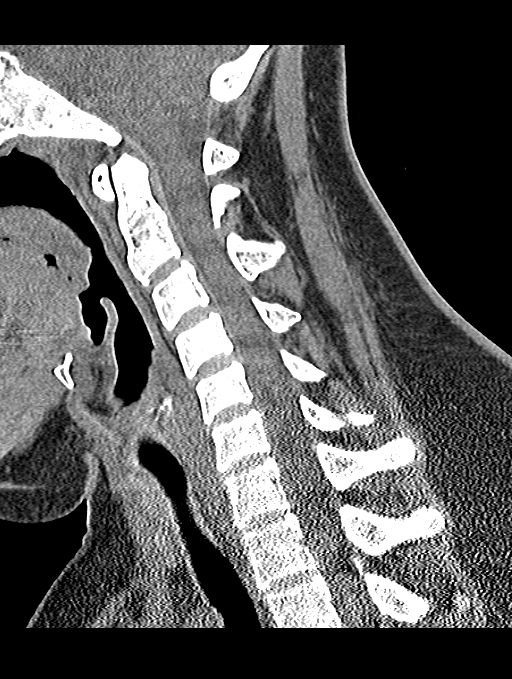
[im 26/51  bone]
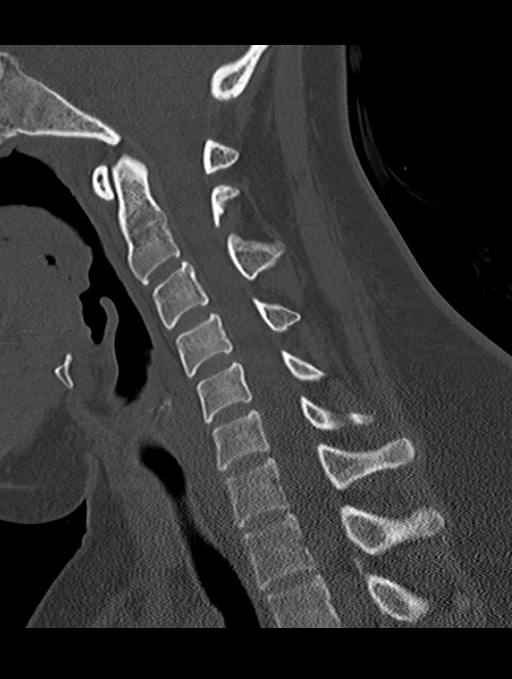
[im 30/51  bone]
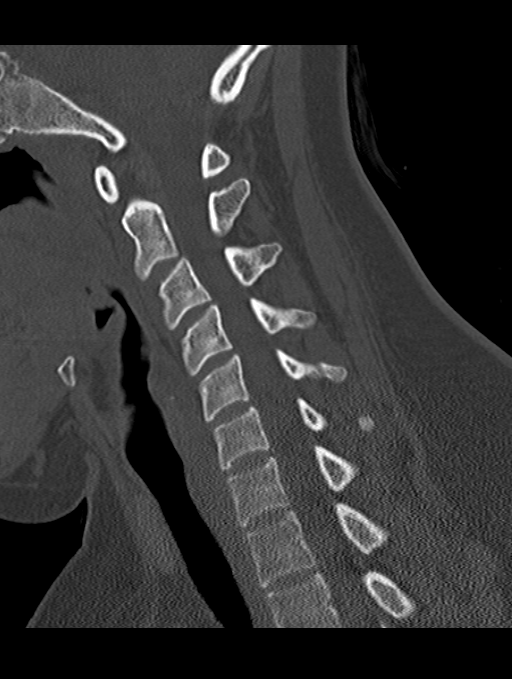
[im 34/51  bone]
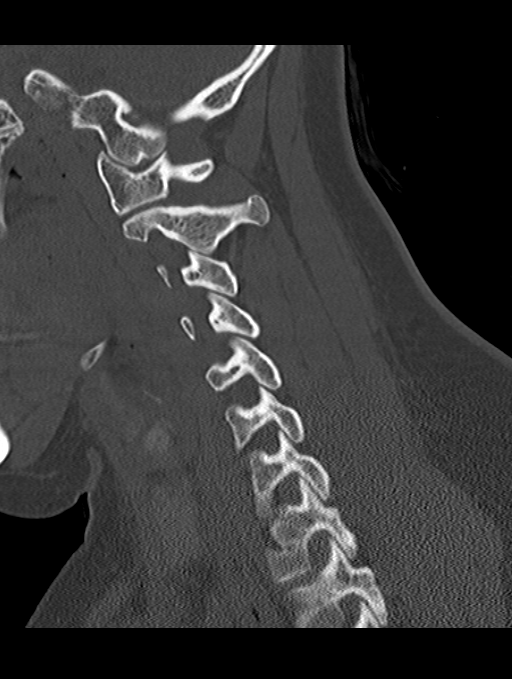

[14 of 33 positions shown; findings below may reference images not displayed]

FINDINGS: CT HEAD FINDINGS

No intracranial abnormalities are identified, including mass lesion
or mass effect, hydrocephalus, extra-axial fluid collection, midline
shift, hemorrhage, or acute infarction.

The visualized bony calvarium is unremarkable.

CT CERVICAL SPINE FINDINGS

Straightening of the normal cervical lordosis is noted.

There is no evidence of acute fracture, subluxation or prevertebral
soft tissue swelling.

The disc spaces are maintained.

No focal bony lesions are present.

Bony structure in the expected location of the right C2 spinous
process is remote and may represent congenital abnormality or remote
injury.

The soft tissue structures and lung apices are unremarkable.
IMPRESSION: Unremarkable noncontrast head CT.

No static evidence of acute injury to the cervical spine.
